# Patient Record
Sex: Male | Born: 1990
Health system: Southern US, Community
[De-identification: ages and names within clinical notes are randomized; demographics above are authoritative.]

## PROBLEM LIST (undated history)

## (undated) DIAGNOSIS — Z8619 Personal history of other infectious and parasitic diseases: Secondary | ICD-10-CM

## (undated) DIAGNOSIS — F32A Depression, unspecified: Secondary | ICD-10-CM

## (undated) DIAGNOSIS — F329 Major depressive disorder, single episode, unspecified: Secondary | ICD-10-CM

## (undated) DIAGNOSIS — F988 Other specified behavioral and emotional disorders with onset usually occurring in childhood and adolescence: Secondary | ICD-10-CM

## (undated) DIAGNOSIS — K219 Gastro-esophageal reflux disease without esophagitis: Secondary | ICD-10-CM

## (undated) DIAGNOSIS — L218 Other seborrheic dermatitis: Secondary | ICD-10-CM

## (undated) DIAGNOSIS — R61 Generalized hyperhidrosis: Secondary | ICD-10-CM

## (undated) HISTORY — DX: Other seborrheic dermatitis: L21.8

## (undated) HISTORY — DX: Other specified behavioral and emotional disorders with onset usually occurring in childhood and adolescence: F98.8

## (undated) HISTORY — DX: Gastro-esophageal reflux disease without esophagitis: K21.9

## (undated) HISTORY — DX: Depression, unspecified: F32.A

## (undated) HISTORY — DX: Personal history of other infectious and parasitic diseases: Z86.19

## (undated) HISTORY — DX: Major depressive disorder, single episode, unspecified: F32.9

## (undated) HISTORY — DX: Generalized hyperhidrosis: R61

---
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
## 2008-08-08 ENCOUNTER — Ambulatory Visit: Payer: Self-pay | Admitting: Licensed Clinical Social Worker

## 2008-08-16 ENCOUNTER — Ambulatory Visit: Payer: Self-pay | Admitting: Licensed Clinical Social Worker

## 2008-08-27 ENCOUNTER — Ambulatory Visit: Payer: Self-pay | Admitting: Licensed Clinical Social Worker

## 2008-09-06 ENCOUNTER — Ambulatory Visit: Payer: Self-pay | Admitting: Licensed Clinical Social Worker

## 2008-09-14 ENCOUNTER — Ambulatory Visit: Payer: Self-pay | Admitting: Family Medicine

## 2008-09-14 DIAGNOSIS — K219 Gastro-esophageal reflux disease without esophagitis: Secondary | ICD-10-CM

## 2008-09-14 DIAGNOSIS — R61 Generalized hyperhidrosis: Secondary | ICD-10-CM

## 2008-09-14 DIAGNOSIS — L218 Other seborrheic dermatitis: Secondary | ICD-10-CM | POA: Insufficient documentation

## 2008-09-14 HISTORY — DX: Other seborrheic dermatitis: L21.8

## 2008-09-14 HISTORY — DX: Generalized hyperhidrosis: R61

## 2008-09-14 HISTORY — DX: Gastro-esophageal reflux disease without esophagitis: K21.9

## 2008-09-20 ENCOUNTER — Ambulatory Visit: Payer: Self-pay | Admitting: Licensed Clinical Social Worker

## 2008-09-21 DIAGNOSIS — F988 Other specified behavioral and emotional disorders with onset usually occurring in childhood and adolescence: Secondary | ICD-10-CM | POA: Insufficient documentation

## 2008-09-21 HISTORY — DX: Other specified behavioral and emotional disorders with onset usually occurring in childhood and adolescence: F98.8

## 2008-10-03 ENCOUNTER — Ambulatory Visit: Payer: Self-pay | Admitting: Licensed Clinical Social Worker

## 2010-06-27 NOTE — Discharge Summary (Signed)
NAME:  Blake [name], SCHOR                        ACCOUNT NO.:  [id number]   MEDICAL RECORD NO.:  [id number]                   PATIENT TYPE:  INP   LOCATION:  0201                                 FACILITY:  [facility]   PHYSICIAN:  Cindie Crumbly, M.D.               DATE OF BIRTH:  [birth year]   DATE OF ADMISSION:  02/26/2002  DATE OF DISCHARGE:  03/03/2002                                 DISCHARGE SUMMARY   REASON FOR ADMISSION:  This 20 year old white male was admitted for  inpatient psychiatric stabilization complaining of suicidal ideation with a  plan to shoot himself with a weapon he had access to.  For further history  of present illness, please see the patient's psychiatric admission  assessment.   PHYSICAL EXAMINATION:  At the time of admission was entirely unremarkable.   LABORATORY DATA:  The patient underwent a laboratory workup to rule out any  other medical problems contributing to his symptomatology.  Urine probe for  gonorrhea and chlamydia were negative.  GGT was within normal limits.  Hepatic panel was within normal limits.  Basic metabolic panel was within  normal limits.  CBC showed hemoglobin of 15.2, MCHC of 34.9 and was  otherwise unremarkable.  Urine drug screen was negative.  TSH and free T4  were within normal limits.  UA was unremarkable.  Because of significant  perseverative behavior demonstrated by the patient, MRI of the brain and a  sleep-deprived EEG were obtained, both of which are pending at the time of  discharge.  The patient received no x-rays, no special procedures, no  additional consultations.  He has sustained no complications during the  course of this hospitalization.   HOSPITAL COURSE:  On admission, the patient was psychomotor retarded with  decreased concentration and attention span.  His affect has been flat.  Mood  has been depressed, irritable with poor impulse control.  He is easily  distracted by extraneous stimuli.  He has been continued  on Zoloft and  titrated up to a dose of 200 mg p.o. q.d.  He was unresponsive to a previous  trial of Strattera and this was discontinued just prior to this admission.  The patient has been started on a trial of Depakote hoping that this may  improve his impulse control and may be beneficial to him if his physical  findings of perseveration and a possible frontal temporal lobe disorder  remain consistent with laboratory findings.  At the time of discharge, the  patient denies any side effects from his medications.  He denies any  suicidal or homicidal ideation.  His affect and mood have improved.  He is  actively participating in all aspects of the therapeutic treatment program.   DIAGNOSES (ACCORDING TO DSM-IV):   AXIS I:  1. Major depression, recurrent, severe without psychosis.  2. Oppositional defiant disorder.  3. Rule out conduct disorder.  4. Attention-deficit hyperactivity disorder, combined-type.  AXIS II:  Rule out learning disorder not otherwise specified.   AXIS III:  Rule out frontal temporal lobe disorder.   AXIS IV:  Current psychosocial stressors are severe.   AXIS V:  20 on admission; 30 on discharge.   FURTHER EVALUATION AND TREATMENT RECOMMENDATIONS:  1. The patient is discharged.  2. He is discharged on an unrestricted level of activity and a regular diet.  3. He is discharged on Depakote ER 500 mg p.o. q.h.s., Zoloft 200 mg p.o.     q.d.  4. He will follow up with Dr. Len Blalock, his outpatient psychiatrist, for     all further aspects of his psychiatric care and I will sign off on the     case at this time.                                               Cindie Crumbly, M.D.    TS/MEDQ  D:  03/03/2002  T:  03/04/2002  Job:  [id number]

## 2010-06-27 NOTE — H&P (Signed)
NAME:  Blake [name], Blake [name]                        ACCOUNT NO.:  [id number]   MEDICAL RECORD NO.:  [id number]                   PATIENT TYPE:  INP   LOCATION:  0201                                 [facility]ACILITY:  [facility]   PHYSICIAN:  Cindie Crumbly, M.D.               DATE OF BIRTH:  November 14, 1990   DATE OF ADMISSION:  02/26/2002  DATE OF DISCHARGE:                         PSYCHIATRIC ADMISSION ASSESSMENT   PATIENT IDENTIFICATION:  This 20 year old white male was admitted for  inpatient psychiatric stabilization complaining of suicidal ideation with a  plan to shoot himself in the temple with a paint ball gun.   HISTORY OF PRESENT ILLNESS:  The patient admits to increasing symptoms of  depression over the past one to two months.  He shot himself in the arm with  a paint ball gun, then threatened to kill himself by shooting himself in the  head with it on the day of admission.  He got on the Internet and instant  messaged his friend a suicide note.  The friend then contacted his parents.  The friend's parents then contacted the patient's parents who transported  him to the hospital for stabilization.  The patient complains of an  increasingly depressed, irritable, and angry mood most of the day nearly  every day, decreased school performance, decreased concentration.  He has  been unable to get out of bed to get to school and has missed at least 10  days of school this past year, which means that he will likely fail the year  and be retained in the eighth grade.  He admits to decreased energy,  increased fatigue, hypersomnia, decreased appetite, giving up on activities  previously enjoyed, feelings of hopelessness, helplessness, and  worthlessness, recurrent thoughts of death.  He is unable to contract for  safety at this time.   PAST PSYCHIATRIC HISTORY:  The patient's past psychiatric history is  significant for recurrent episodes of major depression.  He has one previous  suicide attempt in  the sixth grade when he attempted to eat poisoned  berries.  He has a longstanding history of attention-deficit hyperactivity  disorder, a questionable history of conduct disorder including destruction  of property and truancy.  He has been followed in outpatient therapy by  Onalee Hua L. Toni Arthurs, M.D.  Dr. Toni Arthurs reports that the patient does have  significant perseveration of his behavior to the extent that this may  constitute a frontal lobe disorder.  The patient has no history of previous  inpatient hospitalization.   SUBSTANCE ABUSE HISTORY:  He denies any use of alcohol, tobacco, or street  drugs.   PAST MEDICAL HISTORY:  He denies any medical or surgical problems.   ALLERGIES:  He is allergic to CODEINE.  He has no other known drug allergies  or sensitivities.   CURRENT MEDICATIONS:  His current medication is Zoloft 100 mg p.o. q.d.,  which he has taken for the past two weeks.  Otherwise, he has been taking  Strattera 40 mg per day, which was discontinued three days prior to  admission.  The patient states that the medication made him very angry.   FAMILY AND SOCIAL HISTORY:  The patient lives with his 101 year old sister,  his mother and his father.  He is currently in the eighth grade.  He denies  any family history of psychiatric or neurologic illness.   MENTAL STATUS EXAM:  The patient presents as a well developed, well  nourished, adolescent white male who is alert and oriented x 4, psychomotor  retarded, and whose appearance is compatible with his stated age.  Speech is  coherent with a decreased rate and volume of speech, increased speech  latency.  He displays no looseness of associations, phonemic errors, or  evidence of a thought disorder.  He does display some perseveration of his  behavior.  His concentration and attention span are decreased.  He is easily  distracted by extraneous stimuli.  He displays poor impulse control.  Affect  and mood are depressed, anxious, and  irritable.  Immediate recall, short-  term memory, and remote memory are intact.  Similarities and differences are  within normal limits.  His proverbs are somewhat concrete and consistent  with his educational level.  His thought processes are generally goal  directed but are somewhat perseverative.   ADMISSION DIAGNOSES:   AXIS I:  1. Major depression, recurrent, severe without psychosis.  2. Oppositional defiant disorder.  3. Rule out conduct disorder.  4. Attention-deficit hyperactivity disorder, combined type.   AXIS II:  1. Rule out learning disorder, not otherwise specified.  2. Rule out personality disorder, not otherwise specified.   AXIS III:  Rule out frontal lobe disorder.   AXIS IV:  Severe.   AXIS V:  20   ASSETS AND STRENGTHS:  His parents are very supportive of him.   INITIAL PLAN OF CARE:  Initial plan of care is to continue the patient on  Zoloft.  A trial of Tegretol or Trileptal will likely be considered on an  outpatient basis once the patient returns to see Dr. Toni Arthurs.  Psychotherapy  will focus on improving the patient's impulse control, decreasing cognitive  distortions and potential for self-harm and harm to others.  A laboratory  workup will also be initiated to rule out any other medical problems  contributing to his symptomatology.   ESTIMATED LENGTH OF STAY:  The estimated length of stay for the patient on  the inpatient unit is five to seven days.   POST HOSPITAL CARE PLAN:  Initial discharge plan is to discharge the patient  to home.                                               Cindie Crumbly, M.D.    TS/MEDQ  D:  02/27/2002  T:  02/27/2002  Job:  [id number]

## 2010-10-07 ENCOUNTER — Encounter: Payer: Self-pay | Admitting: Family Medicine

## 2010-10-08 ENCOUNTER — Ambulatory Visit (INDEPENDENT_AMBULATORY_CARE_PROVIDER_SITE_OTHER): Payer: BC Managed Care – PPO | Admitting: Family Medicine

## 2010-10-08 ENCOUNTER — Encounter: Payer: Self-pay | Admitting: Family Medicine

## 2010-10-08 VITALS — BP 110/80 | HR 80 | Temp 98.4°F | Resp 12 | Wt 229.0 lb

## 2010-10-08 DIAGNOSIS — Z0000 Encounter for general adult medical examination without abnormal findings: Secondary | ICD-10-CM

## 2010-10-08 LAB — POCT URINALYSIS DIPSTICK
Blood, UA: NEGATIVE
Leukocytes, UA: NEGATIVE
Nitrite, UA: NEGATIVE
Protein, UA: NEGATIVE
Urobilinogen, UA: 0.2
pH, UA: 6

## 2010-10-08 LAB — CBC WITH DIFFERENTIAL/PLATELET
Basophils Absolute: 0 10*3/uL (ref 0.0–0.1)
Basophils Relative: 0.4 % (ref 0.0–3.0)
Eosinophils Absolute: 0.3 10*3/uL (ref 0.0–0.7)
Eosinophils Relative: 3.4 % (ref 0.0–5.0)
HCT: 50.1 % (ref 39.0–52.0)
Hemoglobin: 17.1 g/dL — ABNORMAL HIGH (ref 13.0–17.0)
Lymphocytes Relative: 27.1 % (ref 12.0–46.0)
Lymphs Abs: 2.4 10*3/uL (ref 0.7–4.0)
MCHC: 34.1 g/dL (ref 30.0–36.0)
MCV: 91.4 fl (ref 78.0–100.0)
Monocytes Absolute: 0.6 10*3/uL (ref 0.1–1.0)
Monocytes Relative: 6.6 % (ref 3.0–12.0)
Neutro Abs: 5.5 10*3/uL (ref 1.4–7.7)
Neutrophils Relative %: 62.5 % (ref 43.0–77.0)
Platelets: 221 10*3/uL (ref 150.0–400.0)
RBC: 5.49 Mil/uL (ref 4.22–5.81)
RDW: 12.9 % (ref 11.5–14.6)
WBC: 8.7 10*3/uL (ref 4.5–10.5)

## 2010-10-08 MED ORDER — TETANUS-DIPHTH-ACELL PERTUSSIS 5-2.5-18.5 LF-MCG/0.5 IM SUSP: 0.5000 mL | Freq: Once | INTRAMUSCULAR | Status: DC

## 2010-10-08 NOTE — Progress Notes (Signed)
Subjective:    Patient ID: Blake [name], male    DOB: 1990-02-12, 20 y.o.   MRN: [id number]  HPI Patient here for complete physical. He has history of recurrent depression and is being followed by psychiatrist. Recent change in medication to Celexa 40 mg daily. No other chronic medical problems. He has also significant anxiety which has been previously treated with Klonopin. No prior surgeries. No known allergies.  Family history significant for both parents with hyperlipidemia. Uncle with alcoholism and depression history.  Last tetanus 2004.  Smokes about one half pack cigarettes per day. Alcohol about 2 beers every other day. No recent escalation in use. Has had some gradual weight gain since high school. Not exercising. Long history of hyperhidrosis. No recent screening lab work  Past Medical History  Diagnosis Date  . ADD 09/21/2008  . GERD 09/14/2008  . HYPERHIDROSIS 09/14/2008  . Other seborrheic dermatitis 09/14/2008  . History of chicken pox   . Depression    History reviewed. No pertinent past surgical history.  reports that he has been smoking Cigarettes.  He has a 2.5 pack-year smoking history. He does not have any smokeless tobacco history on file. He reports that he drinks alcohol. He reports that he does not use illicit drugs. family history includes Alcohol abuse in his other; Hyperlipidemia in his father and mother; and Mental illness in his maternal uncle. No Known Allergies    Review of Systems  Constitutional: Positive for fatigue and unexpected weight change. Negative for fever, activity change and appetite change.  HENT: Negative for ear pain, congestion and trouble swallowing.   Eyes: Negative for pain and visual disturbance.  Respiratory: Negative for cough, shortness of breath and wheezing.   Cardiovascular: Negative for chest pain and palpitations.  Gastrointestinal: Negative for nausea, vomiting, abdominal pain, diarrhea, constipation, blood in stool,  abdominal distention and rectal pain.  Genitourinary: Negative for dysuria, hematuria and testicular pain.  Musculoskeletal: Negative for joint swelling and arthralgias.  Skin: Negative for rash.  Neurological: Negative for dizziness, syncope and headaches.  Hematological: Negative for adenopathy.  Psychiatric/Behavioral: Positive for dysphoric mood. Negative for suicidal ideas and confusion. The patient is nervous/anxious.        Objective:   Physical Exam  Constitutional: He is oriented to person, place, and time. He appears well-developed and well-nourished. No distress.  HENT:  Head: Normocephalic and atraumatic.  Right Ear: External ear normal.  Left Ear: External ear normal.  Mouth/Throat: Oropharynx is clear and moist.  Eyes: Conjunctivae and EOM are normal. Pupils are equal, round, and reactive to light.  Neck: Normal range of motion. Neck supple. No thyromegaly present.  Cardiovascular: Normal rate, regular rhythm and normal heart sounds.   No murmur heard. Pulmonary/Chest: No respiratory distress. He has no wheezes. He has no rales.  Abdominal: Soft. Bowel sounds are normal. He exhibits no distension and no mass. There is no tenderness. There is no rebound and no guarding.  Genitourinary:       Testes normal  Musculoskeletal: He exhibits no edema.  Lymphadenopathy:    He has no cervical adenopathy.  Neurological: He is alert and oriented to person, place, and time. He displays normal reflexes. No cranial nerve deficit.  Skin: No rash noted.  Psychiatric: He has a normal mood and affect.          Assessment & Plan:  Health maintenance. Addressed several items. Needs to quit smoking. Discussed reduction in alcohol. Needs to establish more regular exercise. Obtain screening  lab work.  Tetanus booster given.

## 2010-10-08 NOTE — Patient Instructions (Signed)
Try to quit smoking. Alcohol in moderation (no more than 2 beers per day). Try to establish more regular exercise.

## 2010-10-09 LAB — BASIC METABOLIC PANEL
BUN: 15 mg/dL (ref 6–23)
CO2: 28 mEq/L (ref 19–32)
Calcium: 9.5 mg/dL (ref 8.4–10.5)
Chloride: 104 mEq/L (ref 96–112)
Creatinine, Ser: 1.1 mg/dL (ref 0.4–1.5)
GFR: 86.08 mL/min (ref >60.00)
Glucose, Bld: 89 mg/dL (ref 70–99)
Potassium: 4.7 mEq/L (ref 3.5–5.1)
Sodium: 138 mEq/L (ref 135–145)

## 2010-10-09 LAB — HEPATIC FUNCTION PANEL
ALT: 25 U/L (ref 0–53)
AST: 23 U/L (ref 0–37)
Albumin: 4.8 g/dL (ref 3.5–5.2)
Alkaline Phosphatase: 66 U/L (ref 39–117)
Bilirubin, Direct: 0.1 mg/dL (ref 0.0–0.3)
Total Bilirubin: 0.5 mg/dL (ref 0.3–1.2)
Total Protein: 7.2 g/dL (ref 6.0–8.3)

## 2010-10-09 LAB — LIPID PANEL
Cholesterol: 226 mg/dL — ABNORMAL HIGH (ref 0–200)
HDL: 48.4 mg/dL (ref >39.00)
Total CHOL/HDL Ratio: 5
Triglycerides: 372 mg/dL — ABNORMAL HIGH (ref 0.0–149.0)
VLDL: 74.4 mg/dL — ABNORMAL HIGH (ref 0.0–40.0)

## 2010-10-09 LAB — TSH: TSH: 1.57 u[IU]/mL (ref 0.35–5.50)

## 2010-10-10 NOTE — Progress Notes (Signed)
Quick Note:  Pt informed on VM ______ 

## 2011-08-28 ENCOUNTER — Ambulatory Visit (INDEPENDENT_AMBULATORY_CARE_PROVIDER_SITE_OTHER): Payer: BC Managed Care – PPO | Admitting: Family Medicine

## 2011-08-28 ENCOUNTER — Encounter: Payer: Self-pay | Admitting: Family Medicine

## 2011-08-28 VITALS — BP 120/82 | Temp 98.9°F | Wt 227.0 lb

## 2011-08-28 DIAGNOSIS — F410 Panic disorder [episodic paroxysmal anxiety] without agoraphobia: Secondary | ICD-10-CM | POA: Insufficient documentation

## 2011-08-28 MED ORDER — CLONAZEPAM 0.5 MG PO TABS: 0.5000 mg | ORAL_TABLET | Freq: Two times a day (BID) | ORAL | Status: DC | PRN

## 2011-08-28 NOTE — Progress Notes (Signed)
  Subjective:    Patient ID: Blake [name], male    DOB: Sep 15, 1990, 21 y.o.   MRN: [id number]  HPI  Patient seen for evaluation anxiety issues. He has seen psychiatrist previously but not for several months now. He has history of what sounds like social anxiety disorder and panic disorder. Has been on multiple medications including Celexa, Lexapro, and Cymbalta but did not have good relief with these. Increased fatigue with all 3. He was several years ago on clonazepam 0.5 mg twice daily as needed which she felt worked the best. He tries not to take regularly. Panic disorder symptoms are somewhat infrequent. No clear consistent trigger for anxiety other than crowded social situations. He is reluctant to try any further serotonin medications. He has not tried sertraline previously. No history of any illicit drug use.   Review of Systems  Constitutional: Negative for fever and chills.  Neurological: Negative for dizziness.  Psychiatric/Behavioral: Negative for suicidal ideas and dysphoric mood. The patient is nervous/anxious.        Objective:   Physical Exam  Constitutional: He appears well-developed and well-nourished.  Cardiovascular: Normal rate and regular rhythm.  Exam reveals no gallop.   Pulmonary/Chest: Effort normal and breath sounds normal. No respiratory distress. He has no wheezes. He has no rales.  Psychiatric: He has a normal mood and affect. His behavior is normal. Judgment and thought content normal.          Assessment & Plan:  Anxiety. By history, sounds like he has social anxiety and panic disorder. Refill clonazepam 0.5 mg 1 twice a day when necessary. We discussed other potential options such as sertraline but at this point is not interested

## 2011-12-10 ENCOUNTER — Other Ambulatory Visit: Payer: Self-pay | Admitting: Family Medicine

## 2011-12-11 NOTE — Telephone Encounter (Signed)
Refill for 3 months. 

## 2011-12-11 NOTE — Telephone Encounter (Signed)
done

## 2011-12-11 NOTE — Telephone Encounter (Signed)
Clonazepam bid prn last filled 08-28-11, #60 with 3 refills

## 2012-02-17 ENCOUNTER — Other Ambulatory Visit: Payer: Self-pay | Admitting: Family Medicine

## 2012-02-18 NOTE — Telephone Encounter (Signed)
Clonazepam last filled 12-10-11, #60 with 2 refills.  Pt is scheduled for follow-up mid January

## 2012-02-18 NOTE — Telephone Encounter (Signed)
Refill once 

## 2012-02-29 ENCOUNTER — Ambulatory Visit: Payer: BC Managed Care – PPO | Admitting: Family Medicine

## 2012-03-04 ENCOUNTER — Encounter: Payer: Self-pay | Admitting: Family Medicine

## 2012-03-04 ENCOUNTER — Ambulatory Visit (INDEPENDENT_AMBULATORY_CARE_PROVIDER_SITE_OTHER): Payer: BC Managed Care – PPO | Admitting: Family Medicine

## 2012-03-04 VITALS — BP 100/64 | Temp 98.9°F | Wt 236.0 lb

## 2012-03-04 DIAGNOSIS — F410 Panic disorder [episodic paroxysmal anxiety] without agoraphobia: Secondary | ICD-10-CM

## 2012-03-04 MED ORDER — TRIAMCINOLONE ACETONIDE 0.1 % EX CREA: TOPICAL_CREAM | Freq: Two times a day (BID) | CUTANEOUS | Status: DC | PRN

## 2012-03-04 MED ORDER — CLONAZEPAM 0.5 MG PO TABS: 0.5000 mg | ORAL_TABLET | Freq: Two times a day (BID) | ORAL | Status: DC

## 2012-03-04 NOTE — Progress Notes (Signed)
  Subjective:    Patient ID: Blake [name], male    DOB: [birth year], 22 y.o.   MRN: [id number]  HPI Followup anxiety disorder. History of panic disorder. Previously tried several serotonin medications including Lexapro, Zoloft, Cymbalta and had fatigue with each of these. He has tolerated clonazepam 0.5 mg twice daily. Probably social anxiety disorder as well. Symptoms well controlled. No history of misuse.  Intermittent twitching right upper eyelid. Noted last week. No visual changes. No muscle cramps. No exacerbating factors. No alleviating factors   Review of Systems  Respiratory: Negative for shortness of breath.   Cardiovascular: Negative for chest pain.  Psychiatric/Behavioral: Negative for sleep disturbance and dysphoric mood. The patient is nervous/anxious.        Objective:   Physical Exam  Constitutional: He is oriented to person, place, and time. He appears well-developed and well-nourished.  Eyes: Conjunctivae normal and EOM are normal. Pupils are equal, round, and reactive to light. Right eye exhibits no discharge. Left eye exhibits no discharge.  Neck: Neck supple. No thyromegaly present.  Cardiovascular: Normal rate and regular rhythm.   Pulmonary/Chest: Effort normal and breath sounds normal. No respiratory distress. He has no wheezes. He has no rales.  Musculoskeletal: He exhibits no edema.  Neurological: He is alert and oriented to person, place, and time. No cranial nerve deficit.          Assessment & Plan:  #1 anxiety disorder. Probable panic disorder and social anxiety disorder. Very well-controlled on Klonopin. History of intolerance to multiple serotonin reuptake inhibitors. Refill clonazepam for 6 months #2 benign fasciculation right eyelid. Reassurance

## 2012-09-05 ENCOUNTER — Other Ambulatory Visit: Payer: Self-pay | Admitting: Family Medicine

## 2012-09-05 NOTE — Telephone Encounter (Signed)
Last refill 03/04/12 #60 5 refills

## 2012-09-06 NOTE — Telephone Encounter (Signed)
Refill for 6 months.  Will need office f/u before further refills.

## 2012-12-15 ENCOUNTER — Other Ambulatory Visit: Payer: Self-pay

## 2013-02-20 ENCOUNTER — Ambulatory Visit (INDEPENDENT_AMBULATORY_CARE_PROVIDER_SITE_OTHER): Payer: BC Managed Care – PPO | Admitting: Family Medicine

## 2013-02-20 ENCOUNTER — Encounter: Payer: Self-pay | Admitting: Family Medicine

## 2013-02-20 VITALS — BP 104/70 | HR 112 | Temp 98.9°F | Wt 233.0 lb

## 2013-02-20 DIAGNOSIS — F410 Panic disorder [episodic paroxysmal anxiety] without agoraphobia: Secondary | ICD-10-CM

## 2013-02-20 DIAGNOSIS — L989 Disorder of the skin and subcutaneous tissue, unspecified: Secondary | ICD-10-CM

## 2013-02-20 NOTE — Progress Notes (Signed)
Pre visit review using our clinic review tool, if applicable. No additional management support is needed unless otherwise documented below in the visit note. 

## 2013-02-20 NOTE — Patient Instructions (Signed)
Your back lesion is likely an angioma or angiofibroma.  These are benign lesions but can be easily removed if bothersome.

## 2013-02-20 NOTE — Progress Notes (Signed)
   Subjective:    Patient ID: Blake [name], male    DOB: 1990/08/04, 23 y.o.   MRN: [id number]  HPI Patient seen for medical follow up He has history of anxiety disorder with probable panic disorder and social anxiety. Has tried multiple serotonin reuptake inhibitors but had side effects with all these. He has been on regimen of clonazepam 0.5 mg twice a day for several years. This is controlling his anxiety symptoms well. No history of depression.  Separate issue is erythematous skin lesion right upper back which has been present for several months- possibly one year. No recent changes in size. Occasional irritation with clothing. No itching.  Past Medical History  Diagnosis Date  . ADD 09/21/2008  . GERD 09/14/2008  . HYPERHIDROSIS 09/14/2008  . Other seborrheic dermatitis 09/14/2008  . History of chicken pox   . Depression    No past surgical history on file.  reports that he has quit smoking. His smoking use included Cigarettes. He has a 2.5 pack-year smoking history. He does not have any smokeless tobacco history on file. He reports that he drinks alcohol. He reports that he does not use illicit drugs. family history includes Alcohol abuse in his other; Hyperlipidemia in his father and mother; Mental illness in his maternal uncle. No Known Allergies    Review of Systems  Psychiatric/Behavioral: Negative for dysphoric mood and agitation. The patient is nervous/anxious.        Objective:   Physical Exam  Constitutional: He appears well-developed and well-nourished.  Cardiovascular: Normal rate and regular rhythm.   No murmur heard. Pulmonary/Chest: Effort normal and breath sounds normal. No respiratory distress. He has no wheezes. He has no rales.  Skin:  Right upper back trapezius region reveals about 3-4 mm well demarcated circumferential symmetric angiomatous lesion which blanches with pressure          Assessment & Plan:  #1 anxiety disorder panic disorder and  probable social anxiety disorder. Controlled with clonazepam. Previous intolerance with SSRIs. #2 skin lesion right upper back. Suspect angiofibroma or angioma. He'll schedule for excisional biopsy

## 2013-02-28 ENCOUNTER — Other Ambulatory Visit: Payer: Self-pay | Admitting: Family Medicine

## 2013-03-01 NOTE — Telephone Encounter (Signed)
Refill for 6 months. 

## 2013-03-01 NOTE — Telephone Encounter (Signed)
Last visit 02-20-13 Last refill 09-05-12 #60 5 refill

## 2013-03-17 ENCOUNTER — Emergency Department (HOSPITAL_COMMUNITY)
Admission: EM | Admit: 2013-03-17 | Discharge: 2013-03-18 | Disposition: A | Discharge status: Home / Self Care | Payer: BC Managed Care – PPO | Attending: Emergency Medicine | Admitting: Emergency Medicine

## 2013-03-17 ENCOUNTER — Encounter (HOSPITAL_COMMUNITY): Payer: Self-pay | Admitting: Emergency Medicine

## 2013-03-17 DIAGNOSIS — F3289 Other specified depressive episodes: Secondary | ICD-10-CM | POA: Insufficient documentation

## 2013-03-17 DIAGNOSIS — Z8619 Personal history of other infectious and parasitic diseases: Secondary | ICD-10-CM | POA: Insufficient documentation

## 2013-03-17 DIAGNOSIS — F988 Other specified behavioral and emotional disorders with onset usually occurring in childhood and adolescence: Secondary | ICD-10-CM | POA: Insufficient documentation

## 2013-03-17 DIAGNOSIS — S6992XA Unspecified injury of left wrist, hand and finger(s), initial encounter: Secondary | ICD-10-CM

## 2013-03-17 DIAGNOSIS — S6710XA Crushing injury of unspecified finger(s), initial encounter: Secondary | ICD-10-CM | POA: Insufficient documentation

## 2013-03-17 DIAGNOSIS — L74519 Primary focal hyperhidrosis, unspecified: Secondary | ICD-10-CM | POA: Insufficient documentation

## 2013-03-17 DIAGNOSIS — Z79899 Other long term (current) drug therapy: Secondary | ICD-10-CM | POA: Insufficient documentation

## 2013-03-17 DIAGNOSIS — F329 Major depressive disorder, single episode, unspecified: Secondary | ICD-10-CM | POA: Insufficient documentation

## 2013-03-17 DIAGNOSIS — Y929 Unspecified place or not applicable: Secondary | ICD-10-CM | POA: Insufficient documentation

## 2013-03-17 DIAGNOSIS — Y9389 Activity, other specified: Secondary | ICD-10-CM | POA: Insufficient documentation

## 2013-03-17 DIAGNOSIS — Z87891 Personal history of nicotine dependence: Secondary | ICD-10-CM | POA: Insufficient documentation

## 2013-03-17 DIAGNOSIS — W230XXA Caught, crushed, jammed, or pinched between moving objects, initial encounter: Secondary | ICD-10-CM | POA: Insufficient documentation

## 2013-03-17 NOTE — ED Provider Notes (Signed)
CSN: [id number]     Arrival [id number] & time 03/17/13  2315 History   First MD Initiated Contact with Patient 03/17/13 2326     Chief Complaint  Patient presents with  . Finger Injury   (Consider location/radiation/quality/duration/timing/severity/associated sxs/prior Treatment) The history is provided by the patient and medical records. No language interpreter was used.    Blake [name] is a 23 y.o. male  with a hx of GERD, ADD, depression presents to the Emergency Department complaining of getting his left middle finger caught and crushed while working on a car onset 20 minutes prior to arrival. Associated symptoms include bleeding and pain at the site.  Nothing makes it better or worse.  Patient reports it hurts badly but he does not like blood either. He states he feels "woozy. Patient denies fever, chills, headache neck pain, chest pain, shortness of breath, abdominal pain, nausea, vomiting, diarrhea, weakness, dizziness, syncope.  Last tetanus 2-3 years ago.  Past Medical History  Diagnosis Date  . ADD 09/21/2008  . GERD 09/14/2008  . HYPERHIDROSIS 09/14/2008  . Other seborrheic dermatitis 09/14/2008  . History of chicken pox   . Depression    History reviewed. No pertinent past surgical history. Family History  Problem Relation Age of Onset  . Hyperlipidemia Mother   . Hyperlipidemia Father   . Mental illness Maternal Uncle   . Alcohol abuse Other     uncle   History  Substance Use Topics  . Smoking status: Former Smoker -- 0.50 packs/day for 5 years    Types: Cigarettes  . Smokeless tobacco: Not on file     Comment: E-Cig  . Alcohol Use: Yes    Review of Systems  Constitutional: Negative for fever and chills.  Gastrointestinal: Negative for nausea and vomiting.  Musculoskeletal: Positive for arthralgias and joint swelling. Negative for back pain, neck pain and neck stiffness.  Skin: Positive for wound.  Allergic/Immunologic: Negative for immunocompromised state.   Neurological: Negative for weakness and numbness.  Hematological: Does not bruise/bleed easily.  Psychiatric/Behavioral: The patient is not nervous/anxious.   All other systems reviewed and are negative.    Allergies  Review of patient's allergies indicates no known allergies.  Home Medications   Current Outpatient Rx  Name  Route  Sig  Dispense  Refill  . clonazePAM (KLONOPIN) 0.5 MG tablet      TAKE 1 TABLET TWICE A DAY. NEEDS OFFICE VISIT FOR MORE   60 tablet   5   . doxylamine, Sleep, (UNISOM) 25 MG tablet   Oral   Take 25 mg by mouth at bedtime as needed.         . triamcinolone cream (KENALOG) 0.1 %   Topical   Apply topically 2 (two) times daily as needed.   30 g   3    BP 127/77  Pulse 86  Temp(Src) 98.1 F (36.7 C) (Oral)  Resp 24  Ht 5\' 10"  (1.778 m)  Wt 225 lb (102.059 kg)  BMI 32.28 kg/m2  SpO2 99% Physical Exam  Nursing note and vitals reviewed. Constitutional: He is oriented to person, place, and time. He appears well-developed and well-nourished. No distress.  HENT:  Head: Normocephalic and atraumatic.  Eyes: Conjunctivae are normal. No scleral icterus.  Neck: Normal range of motion.  Cardiovascular: Regular rhythm, normal heart sounds and intact distal pulses.   No murmur heard. Capillary refill < 3 sec in all fingers on the elft hand except Left long - approx 5 sec in  the Left long finger  Pulmonary/Chest: Effort normal and breath sounds normal. No respiratory distress.  Clear and equal breath sounds  Musculoskeletal: Normal range of motion. He exhibits tenderness. He exhibits no edema.  ROM: Full range of motion of the MCP, PIP and DIPs of all fingers of the left hand, significant pain to range of motion of the left long finger  Partial amputation of the tip of the left long finger  Neurological: He is alert and oriented to person, place, and time. Coordination normal.  Sensation: Intact to the proximal portion of the finger, no sensation  to the very distal tip Strength: 5 out of 5 with resisted flexion and extension but with pain  Skin: Skin is warm. He is diaphoretic. There is erythema. There is pallor.  No tenting of the skin Pale  Psychiatric: He has a normal mood and affect.    ED Course  Procedures (including critical care time) Labs Review Labs Reviewed - No data to display Imaging Review Dg Hand Complete Left  03/18/2013   CLINICAL DATA:  23 year old male status post smash injury to left middle finger while working on car. Initial encounter.  EXAM: LEFT HAND - COMPLETE 3+ VIEW  COMPARISON:  None.  FINDINGS: Severely comminuted fracture through the distal third left third distal phalanx, with severe associated soft tissue injury. Appearance of near amputation. Numerous small comminution fragments. DIP preserved and intact.  No other osseous injury identified in the left hand. Distal left radius and ulna intact.  IMPRESSION: Severely comminuted extra-articular fracture left third distal phalanx with severe soft tissue injury; near traumatic amputation.   Electronically Signed   By: Augusto Gamble M.D.   On: 03/18/2013 00:26    EKG Interpretation   None       MDM   1. Injury of left middle finger   2. Crush injury to finger      Blake Ryder presents with traumatic partial amputation of the left long finger. X-ray with Severely comminuted extra-articular fracture left third distal phalanx with severe soft tissue injury; near traumatic amputation.  Patient initially hypertensive, pale and diaphoretic likely vagal response.  Last oral intake 7pm.  Tetanus up-to-date.  12:43 AM Patient blood pressure improved now with pain in the left finger. Will provide pain control.  1:01 AM Discussed with Dr. Merlyn Lot. He will take patient to surgery tonight.  Pt begun on Ancef.      Dahlia Client Federica Allport, PA-C 03/18/13 [phone]

## 2013-03-17 NOTE — ED Notes (Signed)
Pt from home with crush injury to L 3rd finger sustained while working on a car approx PTA, bleeding controlled at this time, tip of finger missing.  10/10 pain. Pt denies other complaints.  Otherwise skin pale/w/d. MAEI, +csm/+pulses.

## 2013-03-18 ENCOUNTER — Emergency Department (HOSPITAL_COMMUNITY): Payer: BC Managed Care – PPO

## 2013-03-18 MED ORDER — BUPIVACAINE HCL (PF) 0.5 % IJ SOLN
INTRAMUSCULAR | Status: AC
  Filled 2013-03-18: qty 30

## 2013-03-18 MED ORDER — SULFAMETHOXAZOLE-TMP DS 800-160 MG PO TABS: 1.0000 | ORAL_TABLET | Freq: Two times a day (BID) | ORAL | Status: DC

## 2013-03-18 MED ORDER — OXYCODONE-ACETAMINOPHEN 5-325 MG PO TABS
2.0000 | ORAL_TABLET | Freq: Once | ORAL | Status: AC
  Administered 2013-03-18: 2 via ORAL
  Filled 2013-03-18: qty 2

## 2013-03-18 MED ORDER — OXYCODONE-ACETAMINOPHEN 5-325 MG PO TABS: 1.0000 | ORAL_TABLET | ORAL | Status: DC | PRN

## 2013-03-18 MED ORDER — CEFAZOLIN SODIUM 1-5 GM-% IV SOLN
1.0000 g | Freq: Once | INTRAVENOUS | Status: AC
  Administered 2013-03-18: 1 g via INTRAVENOUS
  Filled 2013-03-18: qty 50

## 2013-03-18 MED ORDER — FENTANYL CITRATE 0.05 MG/ML IJ SOLN
50.0000 ug | INTRAMUSCULAR | Status: DC | PRN
  Administered 2013-03-18: 50 ug via INTRAVENOUS
  Filled 2013-03-18: qty 2

## 2013-03-18 NOTE — ED Notes (Signed)
Surgeon at bedside,  Consent signed for I& D  Crush injury

## 2013-03-18 NOTE — ED Provider Notes (Signed)
Medical screening examination/treatment/procedure(s) were performed by non-physician practitioner and as supervising physician I was immediately available for consultation/collaboration.     Olivia Mackielga M Vishruth Seoane, MD 03/18/13 [phone]

## 2013-03-18 NOTE — Discharge Instructions (Signed)
1. Medications: bactrim, percocet, usual home medications 2. Treatment: rest, drink plenty of fluids, care for your splint and wound as directed by Dr. Merlyn LotKuzma 3. Follow Up: Please followup with Dr. Merlyn LotKuzma as directed in 11 days   Fingertip Injuries and Amputations Fingertip injuries are common and often get injured because they are last to escape when pulling your hand out of harm's way. You have amputated (cut off) part of your finger. How this turns out depends largely on how much was amputated. If just the tip is amputated, often the end of the finger will grow back and the finger may return to much the same as it was before the injury.  If more of the finger is missing, your caregiver has done the best with the tissue remaining to allow you to keep as much finger as is possible. Your caregiver after checking your injury has tried to leave you with a painless fingertip that has durable, feeling skin. If possible, your caregiver has tried to maintain the finger's length and appearance and preserve its fingernail.  Please read the instructions outlined below and refer to this sheet in the next few weeks. These instructions provide you with general information on caring for yourself. Your caregiver may also give you specific instructions. While your treatment has been done according to the most current medical practices available, unavoidable complications occasionally occur. If you have any problems or questions after discharge, please call your caregiver. HOME CARE INSTRUCTIONS   You may resume normal diet and activities as directed or allowed.  Keep your hand elevated above the level of your heart. This helps decrease pain and swelling.  Keep ice packs (or a bag of ice wrapped in a towel) on the injured area for 15-20 minutes, 03-04 times per day, for the first two days.  Change dressings if necessary or as directed.  Clean the wound daily or as directed.  Only take over-the-counter or  prescription medicines for pain, discomfort, or fever as directed by your caregiver.  Keep appointments as directed. SEEK IMMEDIATE MEDICAL CARE IF:  You develop redness, swelling, numbness or increasing pain in the wound.  There is pus coming from the wound.  You develop an unexplained oral temperature above 102 F (38.9 C) or as your caregiver suggests.  There is a foul (bad) smell coming from the wound or dressing.  There is a breaking open of the wound (edges not staying together) after sutures or staples have been removed. MAKE SURE YOU:   Understand these instructions.  Will watch your condition.  Will get help right away if you are not doing well or get worse. Document Released: 12/17/2004 Document Revised: 04/20/2011 Document Reviewed: 11/16/2007 [facility] Patient Information 2014 [place] [place].

## 2013-03-19 NOTE — Consult Note (Signed)
NAMEJOAHAN, [name] NO.:  [id number]  MEDICAL RECORD NO.:  [id number]  LOCATION:  [id number]                         FACILITY:  [facility]  PHYSICIAN:  Blake Loa, MD        DATE OF BIRTH:  1990-05-04  DATE OF CONSULTATION:  03/18/2013 DATE OF DISCHARGE:  03/18/2013                                CONSULTATION   Consult is from [facility] Emergency Department.  Consult is for left long fingertip crush injury.  HISTORY:  Blake [name] is a 23 year old, right-hand dominant male who states he was working on a suspension spring on a vehicle when the spring crushed his left long fingertip.  He had to use a crowbar to get it out.  He was seen at [facility] Emergency Department, where he was evaluated.  I was consulted for management of injury.  He reports no previous injuries to the hand and no other injuries at this time.  ALLERGIES:  No known drug allergies.  PAST MEDICAL HISTORY:  Hyperhidrosis, panic disorder, GERD.  PAST SURGICAL HISTORY:  None.  MEDICATIONS:  Klonopin and doxylamine.  SOCIAL HISTORY:  Blake [name] does not currently work.  He uses a vapor cigarette and drinks occasionally.  REVIEW OF SYSTEMS:  A 13-point review of systems was negative.  PHYSICAL EXAMINATION:  GENERAL:  Alert and oriented x3, well developed, well nourished.  He is resting comfortably in the hospital stretcher. EXTREMITIES:  Bilateral upper extremities are intact to light touch sensation, capillary refill in the fingertips with exception of the left long finger, where he has decreased sensation in the tip of the finger. He has had a digital block which is starting to wear off.  He has good capillary refill.  Right upper extremity wounds are tender to palpation. Left upper extremity with the exception of the long finger.  No wounds or tenderness to palpation.  In the long finger, he has a laceration at the ulnar aspect of the distal phalanx through the base of the nail bed. The tissues are  intact on the radial side, disrupted on the ulnar side. No gross contamination.  No other wounds.  He is able to flex and extend at the DIP joint.  RADIOGRAPHS:  AP, lateral, and oblique views of the hand show a fracture of the distal phalanx at the proximal aspect of the tuft of the long finger.  No other fractures, dislocations, or radiopaque foreign bodies are noted.  ASSESSMENT AND PLAN:  Left long fingertip crush injury.  I discussed with Blake [name] the nature of the injury.  I recommended repair of the laceration and reduction of the fracture under digital block in the emergency department.  Risks, benefits and alternatives of the surgery were discussed including risk of blood loss, infection, damage to deeper structures, failure of the procedure, need for additional procedures; complications with wound healing, and nail deformity.  He voiced understanding of these risks and elected to proceed.  PROCEDURE NOTE:  Digital block was performed with 10 mL of half and half solution of 1% plain lidocaine and 0.25% plain Marcaine.  This was adequate to give total digital anesthesia.  The wound was copiously irrigated with a 1000 mL  of sterile saline and Betadine solution.  It was then sterilely prepped with Betadine and draped with sterile towels. A Penrose drain was used as a tourniquet and was up for approximately 40 minutes.  The nail was removed with a Therapist, nutritionalreer elevator.  The wound was then cleaned of its clot.  The fracture was reduced under direct visualization.  A 4-0 Monocryl suture was then used in interrupted fashion to repair the skin wounds.  The nail bed laceration was repaired with 6-0 chromic suture in a interrupted or a horizontal mattress fashion.  This provided good apposition of all soft tissues.  A piece of Xeroform was placed in the nail fold and all wounds were dressed with sterile Xeroform, 4x4s, and a removable splint.  This was wrapped with Coban dressing lightly.   The Penrose drain was removed.  He tolerated the procedure well.  I will see him back in the office in approximately 1-1/2 weeks as he is going out of town for a week.  He will avoid heavy gripping, grasping, pushing and pulling activities while he is out of town.  He will be given pain meds and antibiotics per the emergency department.     Blake LoaKevin Marlon Suleiman, MD     KK/MEDQ  D:  03/18/2013  T:  03/19/2013  Job:  [id number]

## 2013-08-13 ENCOUNTER — Other Ambulatory Visit: Payer: Self-pay | Admitting: Family Medicine

## 2013-08-14 NOTE — Telephone Encounter (Signed)
Last visit 02/20/13 Last refill 02/28/13 #60 5 refill

## 2013-08-14 NOTE — Telephone Encounter (Signed)
Refill for 6 months. 

## 2014-01-17 ENCOUNTER — Encounter: Payer: Self-pay | Admitting: Family Medicine

## 2014-01-17 ENCOUNTER — Ambulatory Visit (INDEPENDENT_AMBULATORY_CARE_PROVIDER_SITE_OTHER): Payer: BC Managed Care – PPO | Admitting: Family Medicine

## 2014-01-17 VITALS — BP 120/80 | HR 86 | Temp 97.9°F | Wt 220.0 lb

## 2014-01-17 DIAGNOSIS — F410 Panic disorder [episodic paroxysmal anxiety] without agoraphobia: Secondary | ICD-10-CM

## 2014-01-17 MED ORDER — CLONAZEPAM 0.5 MG PO TABS: 0.5000 mg | ORAL_TABLET | Freq: Two times a day (BID) | ORAL | Status: DC

## 2014-01-17 NOTE — Progress Notes (Signed)
   Subjective:    Patient ID: Blake [name], male    DOB: Sep 03, 1990, 23 y.o.   MRN: [id number]  HPI Follow-up regarding chronic anxiety disorder. He has history of probable panic disorder. As per previous notes he was on many different SSRI medications previously but had side effects with all of them. He has for many years been on low-dose clonazepam 0.5 mg twice a day. No history of misuse. Rare alcohol use. Symptoms stable. No depression. He's lost some weight this year due to his efforts.  Past Medical History  Diagnosis Date  . ADD 09/21/2008  . GERD 09/14/2008  . HYPERHIDROSIS 09/14/2008  . Other seborrheic dermatitis 09/14/2008  . History of chicken pox   . Depression    No past surgical history on file.  reports that he has quit smoking. His smoking use included Cigarettes. He has a 2.5 pack-year smoking history. He does not have any smokeless tobacco history on file. He reports that he drinks alcohol. He reports that he does not use illicit drugs. family history includes Alcohol abuse in his other; Hyperlipidemia in his father and mother; Mental illness in his maternal uncle. No Known Allergies    Review of Systems  Constitutional: Negative for appetite change and unexpected weight change.  Respiratory: Negative for cough and shortness of breath.   Cardiovascular: Negative for chest pain.  Psychiatric/Behavioral: Negative for dysphoric mood and agitation.       Objective:   Physical Exam  Constitutional: He appears well-developed and well-nourished. No distress.  Neck: Neck supple. No thyromegaly present.  Cardiovascular: Normal rate and regular rhythm.   No murmur heard. Pulmonary/Chest: Effort normal and breath sounds normal. No respiratory distress. He has no wheezes. He has no rales.  Lymphadenopathy:    He has no cervical adenopathy.  Psychiatric: He has a normal mood and affect. His behavior is normal. Judgment and thought content normal.          Assessment &  Plan:  Anxiety disorder with probable panic attack. Intolerant of multiple SSRIs in the past. Refill clonazepam for 6 months. He has been stable on low dose.

## 2014-01-17 NOTE — Progress Notes (Signed)
Pre visit review using our clinic review tool, if applicable. No additional management support is needed unless otherwise documented below in the visit note. 

## 2014-05-21 ENCOUNTER — Ambulatory Visit: Payer: BLUE CROSS/BLUE SHIELD | Admitting: Psychology

## 2014-05-21 DIAGNOSIS — F329 Major depressive disorder, single episode, unspecified: Secondary | ICD-10-CM | POA: Diagnosis not present

## 2014-05-21 DIAGNOSIS — F419 Anxiety disorder, unspecified: Secondary | ICD-10-CM | POA: Diagnosis not present

## 2014-05-21 DIAGNOSIS — F900 Attention-deficit hyperactivity disorder, predominantly inattentive type: Secondary | ICD-10-CM | POA: Diagnosis not present

## 2014-06-11 ENCOUNTER — Ambulatory Visit: Payer: BLUE CROSS/BLUE SHIELD | Admitting: Psychology

## 2014-06-11 DIAGNOSIS — F902 Attention-deficit hyperactivity disorder, combined type: Secondary | ICD-10-CM | POA: Diagnosis not present

## 2014-06-11 DIAGNOSIS — F4323 Adjustment disorder with mixed anxiety and depressed mood: Secondary | ICD-10-CM | POA: Diagnosis not present

## 2014-06-19 ENCOUNTER — Ambulatory Visit: Payer: BLUE CROSS/BLUE SHIELD | Admitting: Psychology

## 2014-06-19 DIAGNOSIS — F900 Attention-deficit hyperactivity disorder, predominantly inattentive type: Secondary | ICD-10-CM | POA: Diagnosis not present

## 2014-06-19 DIAGNOSIS — F411 Generalized anxiety disorder: Secondary | ICD-10-CM | POA: Diagnosis not present

## 2014-06-21 ENCOUNTER — Ambulatory Visit: Payer: BLUE CROSS/BLUE SHIELD | Admitting: Psychology

## 2014-07-05 ENCOUNTER — Ambulatory Visit: Payer: BLUE CROSS/BLUE SHIELD | Admitting: Psychology

## 2014-07-05 DIAGNOSIS — F320 Major depressive disorder, single episode, mild: Secondary | ICD-10-CM | POA: Diagnosis not present

## 2014-07-05 DIAGNOSIS — F411 Generalized anxiety disorder: Secondary | ICD-10-CM | POA: Diagnosis not present

## 2014-07-05 DIAGNOSIS — F900 Attention-deficit hyperactivity disorder, predominantly inattentive type: Secondary | ICD-10-CM | POA: Diagnosis not present

## 2014-07-12 ENCOUNTER — Other Ambulatory Visit: Payer: Self-pay | Admitting: Family Medicine

## 2014-07-13 NOTE — Telephone Encounter (Signed)
Refill for 6 months. 

## 2014-07-13 NOTE — Telephone Encounter (Signed)
Last visit 01/17/14 Last refill 01/17/14 #60 5 refill

## 2014-07-16 ENCOUNTER — Ambulatory Visit: Payer: BLUE CROSS/BLUE SHIELD | Admitting: Psychology

## 2014-07-30 ENCOUNTER — Ambulatory Visit: Payer: BLUE CROSS/BLUE SHIELD | Admitting: Psychology

## 2014-08-09 ENCOUNTER — Ambulatory Visit: Payer: BLUE CROSS/BLUE SHIELD | Admitting: Psychology

## 2014-10-23 IMAGING — CR DG HAND COMPLETE 3+V*L*
3 series · 3 of 3 positions shown · non-contrast
Comparison: None.

CLINICAL DATA: 22-year-old male status post smash injury to left
middle finger while working on car. Initial encounter.

EXAM:
LEFT HAND - COMPLETE 3+ VIEW

[x hand pa left]
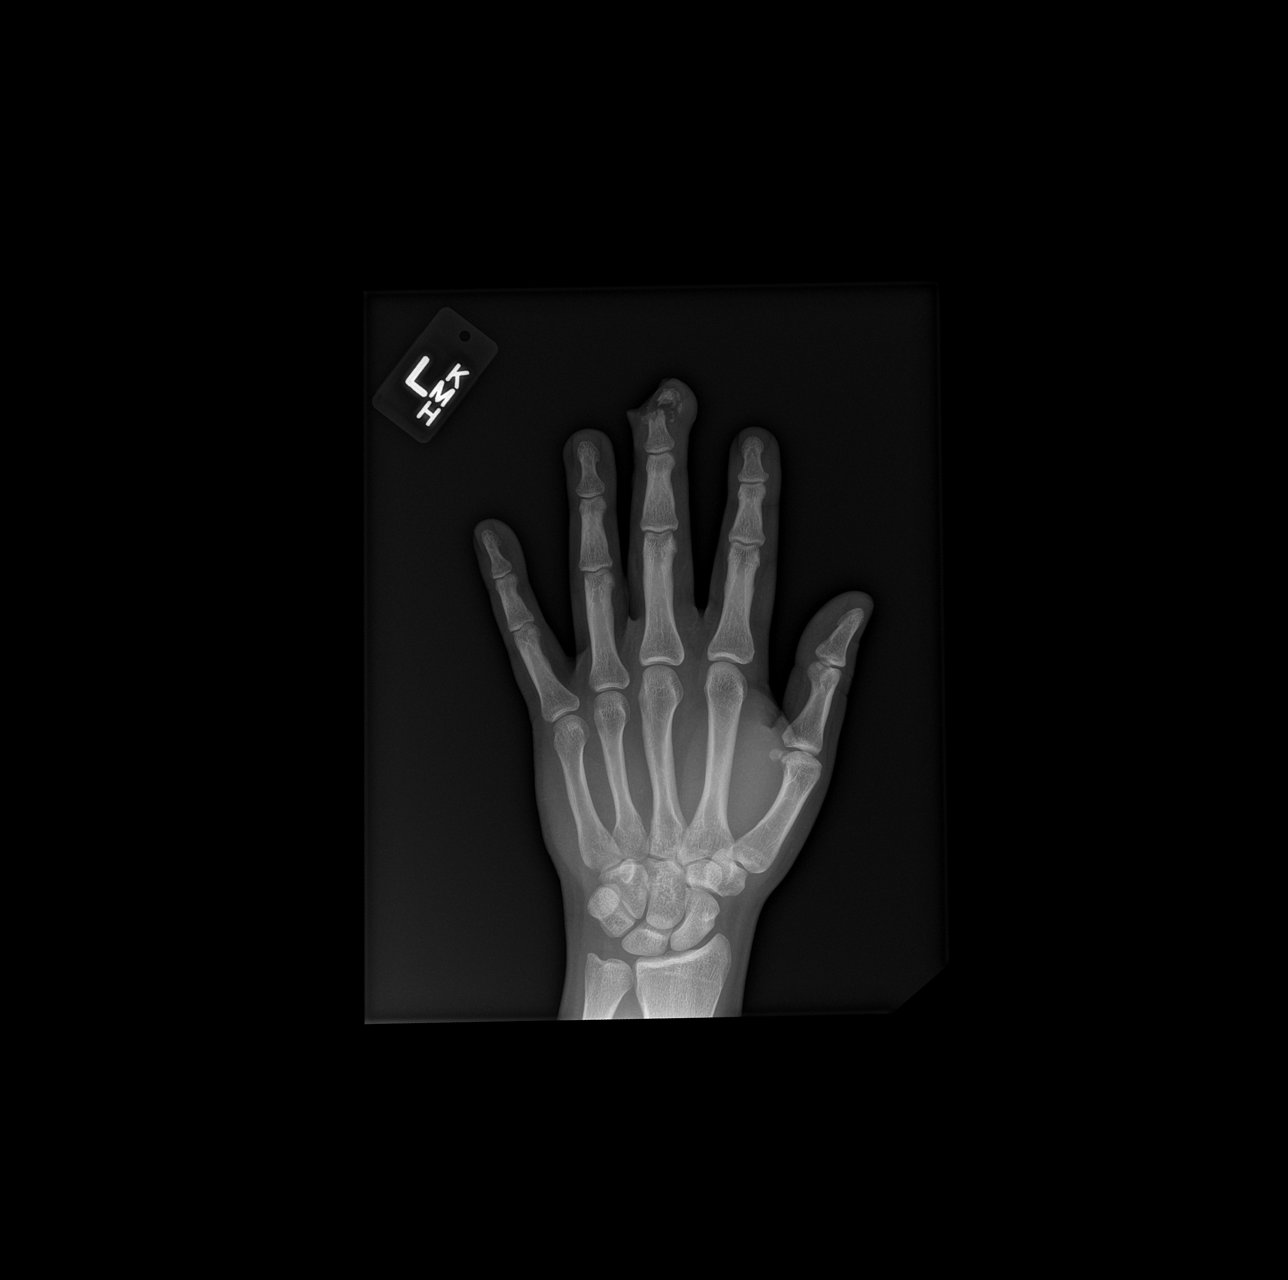

[x hand obl left]
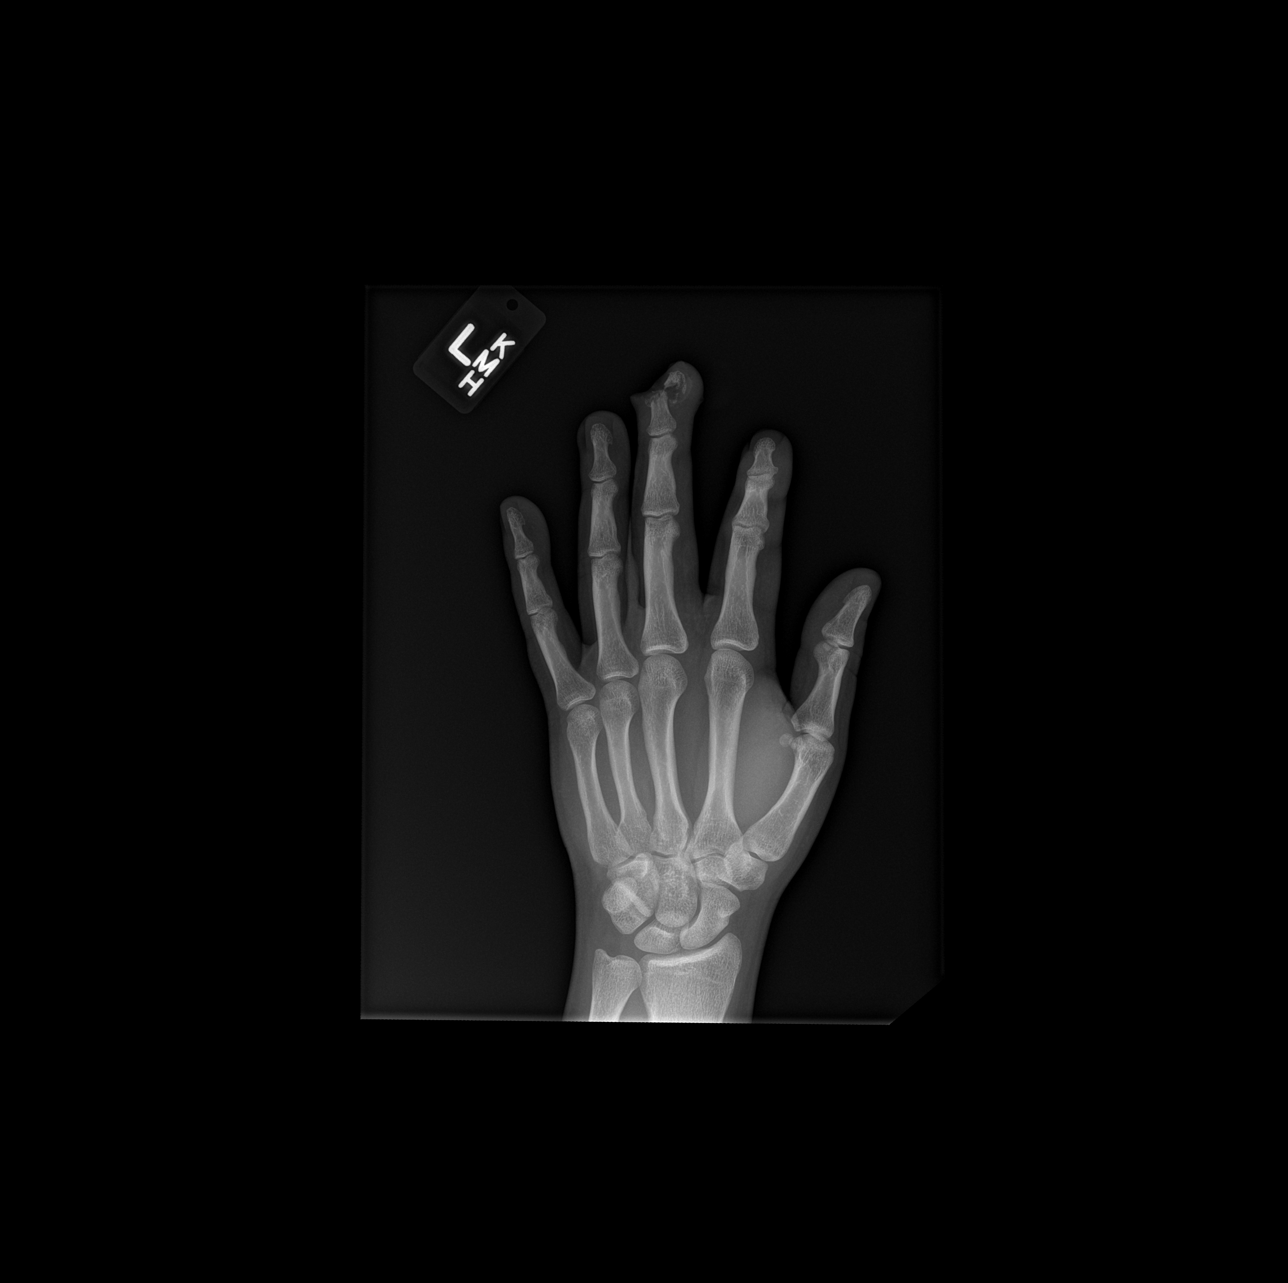

[x hand lat left]
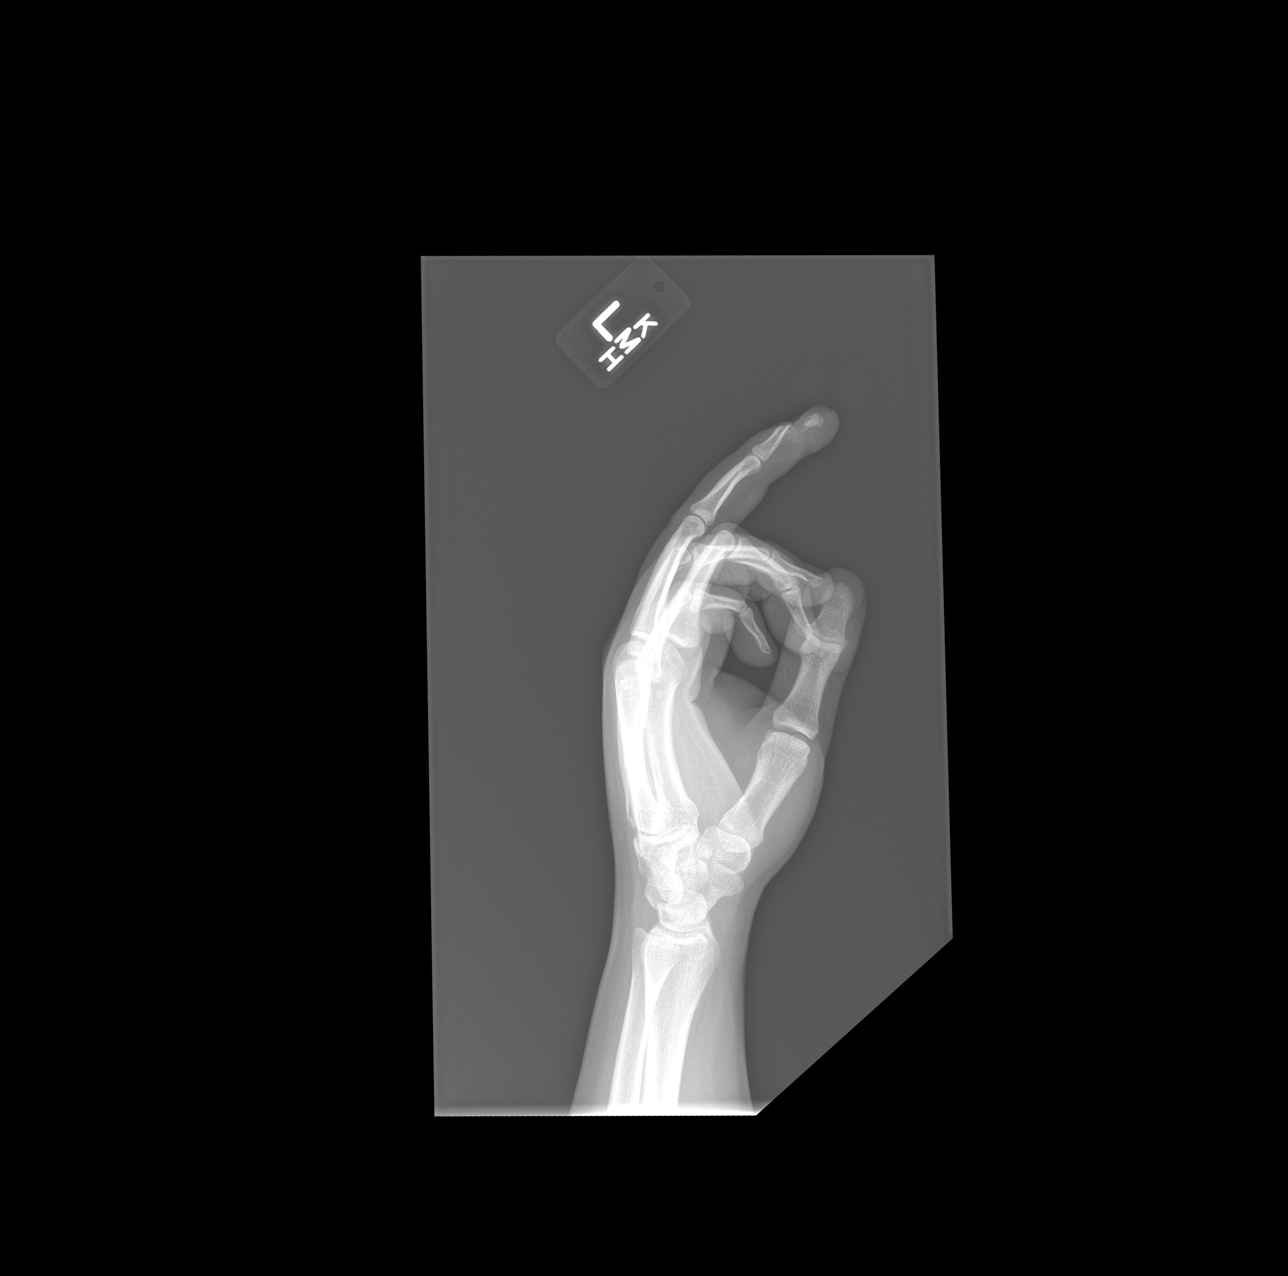

[3 of 3 positions shown; findings below may reference images not displayed]

FINDINGS: Severely comminuted fracture through the distal third left third
distal phalanx, with severe associated soft tissue injury.
Appearance of near amputation. Numerous small comminution fragments.
DIP preserved and intact.

No other osseous injury identified in the left hand. Distal left
radius and ulna intact.
IMPRESSION: Severely comminuted extra-articular fracture left third distal
phalanx with severe soft tissue injury; near traumatic amputation.

## 2014-12-26 ENCOUNTER — Other Ambulatory Visit: Payer: Self-pay | Admitting: Family Medicine

## 2015-01-14 ENCOUNTER — Other Ambulatory Visit (INDEPENDENT_AMBULATORY_CARE_PROVIDER_SITE_OTHER): Payer: BLUE CROSS/BLUE SHIELD

## 2015-01-14 DIAGNOSIS — R7989 Other specified abnormal findings of blood chemistry: Secondary | ICD-10-CM | POA: Diagnosis not present

## 2015-01-14 DIAGNOSIS — Z0000 Encounter for general adult medical examination without abnormal findings: Secondary | ICD-10-CM | POA: Diagnosis not present

## 2015-01-14 LAB — LDL CHOLESTEROL, DIRECT: Direct LDL: 131 mg/dL

## 2015-01-14 LAB — BASIC METABOLIC PANEL
BUN: 15 mg/dL (ref 6–23)
CO2: 28 meq/L (ref 19–32)
Calcium: 9.8 mg/dL (ref 8.4–10.5)
Chloride: 104 mEq/L (ref 96–112)
Creatinine, Ser: 1.05 mg/dL (ref 0.40–1.50)
GFR: 90.41 mL/min (ref >60.00)
Glucose, Bld: 93 mg/dL (ref 70–99)
POTASSIUM: 5 meq/L (ref 3.5–5.1)
Sodium: 143 mEq/L (ref 135–145)

## 2015-01-14 LAB — CBC WITH DIFFERENTIAL/PLATELET
BASOS ABS: 0 10*3/uL (ref 0.0–0.1)
BASOS PCT: 0.4 % (ref 0.0–3.0)
EOS ABS: 0.3 10*3/uL (ref 0.0–0.7)
Eosinophils Relative: 3.5 % (ref 0.0–5.0)
HCT: 48.6 % (ref 39.0–52.0)
HEMOGLOBIN: 16.4 g/dL (ref 13.0–17.0)
Lymphocytes Relative: 31.5 % (ref 12.0–46.0)
Lymphs Abs: 2.5 10*3/uL (ref 0.7–4.0)
MCHC: 33.7 g/dL (ref 30.0–36.0)
MCV: 87.4 fl (ref 78.0–100.0)
Monocytes Absolute: 0.6 10*3/uL (ref 0.1–1.0)
Monocytes Relative: 7.7 % (ref 3.0–12.0)
NEUTROS ABS: 4.5 10*3/uL (ref 1.4–7.7)
Neutrophils Relative %: 56.9 % (ref 43.0–77.0)
Platelets: 226 10*3/uL (ref 150.0–400.0)
RBC: 5.57 Mil/uL (ref 4.22–5.81)
RDW: 12.6 % (ref 11.5–15.5)
WBC: 7.9 10*3/uL (ref 4.0–10.5)

## 2015-01-14 LAB — LIPID PANEL
CHOL/HDL RATIO: 5
CHOLESTEROL: 241 mg/dL — AB (ref 0–200)
HDL: 44.2 mg/dL (ref >39.00)
NonHDL: 196.92
TRIGLYCERIDES: 364 mg/dL — AB (ref 0.0–149.0)
VLDL: 72.8 mg/dL — ABNORMAL HIGH (ref 0.0–40.0)

## 2015-01-14 LAB — HEPATIC FUNCTION PANEL
ALK PHOS: 60 U/L (ref 39–117)
ALT: 30 U/L (ref 0–53)
AST: 18 U/L (ref 0–37)
Albumin: 4.6 g/dL (ref 3.5–5.2)
BILIRUBIN DIRECT: 0.1 mg/dL (ref 0.0–0.3)
BILIRUBIN TOTAL: 0.4 mg/dL (ref 0.2–1.2)
Total Protein: 6.8 g/dL (ref 6.0–8.3)

## 2015-01-14 LAB — TSH: TSH: 3.17 u[IU]/mL (ref 0.35–4.50)

## 2015-01-21 ENCOUNTER — Encounter: Payer: BLUE CROSS/BLUE SHIELD | Admitting: Family Medicine

## 2015-01-21 ENCOUNTER — Telehealth: Payer: Self-pay | Admitting: Family Medicine

## 2015-01-21 NOTE — Telephone Encounter (Signed)
Refill for one month until follow up. 

## 2015-01-21 NOTE — Telephone Encounter (Signed)
Pt's mother called saying he's been out of Klonopin for four days. He couldn't make his appt today due to having a "major panic attack." He's wondering if a refill of the medication can be sent to [facility] [address] for him. He rescheduled his CPE to 1.5.17. Please call the pt or his mother if you have questions or concerns.  Pt's ph# [phone] Pt's mother, Erskine SquibbJane ph# [phone] Thank you.

## 2015-01-21 NOTE — Telephone Encounter (Signed)
Last fill was 12/26/2014 #60. Please advise.

## 2015-01-22 MED ORDER — CLONAZEPAM 0.5 MG PO TABS: 0.5000 mg | ORAL_TABLET | Freq: Two times a day (BID) | ORAL | Status: DC

## 2015-01-22 NOTE — Telephone Encounter (Signed)
Printed for signature

## 2015-01-22 NOTE — Telephone Encounter (Signed)
Pts mother is aware that RX has been faxed to pharmacy.

## 2015-02-14 ENCOUNTER — Ambulatory Visit (INDEPENDENT_AMBULATORY_CARE_PROVIDER_SITE_OTHER): Payer: BLUE CROSS/BLUE SHIELD | Admitting: Family Medicine

## 2015-02-14 ENCOUNTER — Encounter: Payer: Self-pay | Admitting: Family Medicine

## 2015-02-14 VITALS — BP 110/90 | HR 118 | Temp 98.6°F | Resp 16 | Ht 69.5 in | Wt 231.0 lb

## 2015-02-14 DIAGNOSIS — Z0000 Encounter for general adult medical examination without abnormal findings: Secondary | ICD-10-CM

## 2015-02-14 DIAGNOSIS — F410 Panic disorder [episodic paroxysmal anxiety] without agoraphobia: Secondary | ICD-10-CM | POA: Diagnosis not present

## 2015-02-14 MED ORDER — FLUOXETINE HCL 20 MG PO CAPS: 20.0000 mg | ORAL_CAPSULE | Freq: Every day | ORAL | Status: DC

## 2015-02-14 NOTE — Patient Instructions (Addendum)
Why follow it? Research shows. Those who follow the Mediterranean diet have a reduced risk of heart disease  The diet is associated with a reduced incidence of Parkinson's and Alzheimer's diseases People following the diet may have longer life expectancies and lower rates of chronic diseases  The Dietary Guidelines for Americans recommends the Mediterranean diet as an eating plan to promote health and prevent disease  What Is the Mediterranean Diet?  Healthy eating plan based on typical foods and recipes of Mediterranean-style cooking The diet is primarily a plant based diet; these foods should make up a majority of meals   Starches - Plant based foods should make up a majority of meals - They are an important sources of vitamins, minerals, energy, antioxidants, and fiber - Choose whole grains, foods high in fiber and minimally processed items  - Typical grain sources include wheat, oats, barley, corn, brown rice, bulgar, farro, millet, polenta, couscous  - Various types of beans include chickpeas, lentils, fava beans, black beans, white beans   Fruits  Veggies - Large quantities of antioxidant rich fruits & veggies; 6 or more servings  - Vegetables can be eaten raw or lightly drizzled with oil and cooked  - Vegetables common to the traditional Mediterranean Diet include: artichokes, arugula, beets, broccoli, brussel sprouts, cabbage, carrots, celery, collard greens, cucumbers, eggplant, kale, leeks, lemons, lettuce, mushrooms, okra, onions, peas, peppers, potatoes, pumpkin, radishes, rutabaga, shallots, spinach, sweet potatoes, turnips, zucchini - Fruits common to the Mediterranean Diet include: apples, apricots, avocados, cherries, clementines, dates, figs, grapefruits, grapes, melons, nectarines, oranges, peaches, pears, pomegranates, strawberries, tangerines  Fats - Replace butter and margarine with healthy oils, such as olive oil, canola oil, and tahini  - Limit nuts to no more than a  handful a day  - Nuts include walnuts, almonds, pecans, pistachios, pine nuts  - Limit or avoid candied, honey roasted or heavily salted nuts - Olives are central to the Mediterranean diet - can be eaten whole or used in a variety of dishes   Meats Protein - Limiting red meat: no more than a few times a month - When eating red meat: choose lean cuts and keep the portion to the size of deck of cards - Eggs: approx. 0 to 4 times a week  - Fish and lean poultry: at least 2 a week  - Healthy protein sources include, chicken, turkey, lean beef, lamb - Increase intake of seafood such as tuna, salmon, trout, mackerel, shrimp, scallops - Avoid or limit high fat processed meats such as sausage and bacon  Dairy - Include moderate amounts of low fat dairy products  - Focus on healthy dairy such as fat free yogurt, skim milk, low or reduced fat cheese - Limit dairy products higher in fat such as whole or 2% milk, cheese, ice cream  Alcohol - Moderate amounts of red wine is ok  - No more than 5 oz daily for women (all ages) and men older than age 65  - No more than 10 oz of wine daily for men younger than 65  Other - Limit sweets and other desserts  - Use herbs and spices instead of salt to flavor foods  - Herbs and spices common to the traditional Mediterranean Diet include: basil, bay leaves, chives, cloves, cumin, fennel, garlic, lavender, marjoram, mint, oregano, parsley, pepper, rosemary, sage, savory, sumac, tarragon, thyme   It's not just a diet, it's a lifestyle:  The Mediterranean diet includes lifestyle factors typical of those in the   region  Foods, drinks and meals are best eaten with others and savored Daily physical activity is important for overall good health This could be strenuous exercise like running and aerobics This could also be more leisurely activities such as walking, housework, yard-work, or taking the stairs Moderation is the key; a balanced and healthy diet accommodates most  foods and drinks Consider portion sizes and frequency of consumption of certain foods   Meal Ideas & Options:  Breakfast:  Whole wheat toast or whole wheat English muffins with peanut butter & hard boiled egg Steel cut oats topped with apples & cinnamon and skim milk  Fresh fruit: banana, strawberries, melon, berries, peaches  Smoothies: strawberries, bananas, greek yogurt, peanut butter Low fat greek yogurt with blueberries and granola  Egg white omelet with spinach and mushrooms Breakfast couscous: whole wheat couscous, apricots, skim milk, cranberries  Sandwiches:  Hummus and grilled vegetables (peppers, zucchini, squash) on whole wheat bread   Grilled chicken on whole wheat pita with lettuce, tomatoes, cucumbers or tzatziki  [place] salad on whole wheat bread: tuna salad made with greek yogurt, olives, red peppers, capers, green onions Garlic rosemary lamb pita: lamb sauted with garlic, rosemary, salt & pepper; add lettuce, cucumber, greek yogurt to pita - flavor with lemon juice and black pepper  Seafood:  Mediterranean grilled salmon, seasoned with garlic, basil, parsley, lemon juice and black pepper Shrimp, lemon, and spinach whole-grain pasta salad made with low fat greek yogurt  Seared scallops with lemon orzo  Seared tuna steaks seasoned salt, pepper, coriander topped with tomato mixture of olives, tomatoes, olive oil, minced garlic, parsley, green onions and cappers  Meats:  Herbed greek chicken salad with kalamata olives, cucumber, feta  Red bell peppers stuffed with spinach, bulgur, lean ground beef (or lentils) & topped with feta   Kebabs: skewers of chicken, tomatoes, onions, zucchini, squash  [place] burgers: made with red onions, mint, dill, lemon juice, feta cheese topped with roasted red peppers Vegetarian Cucumber salad: cucumbers, artichoke hearts, celery, red onion, feta cheese, tossed in olive oil & lemon juice  Hummus and whole grain pita points with a greek salad  (lettuce, tomato, feta, olives, cucumbers, red onion) Lentil soup with celery, carrots made with vegetable broth, garlic, salt and pepper  Tabouli salad: parsley, bulgur, mint, scallions, cucumbers, tomato, radishes, lemon juice, olive oil, salt and pepper.     Food Choices to Lower Your Triglycerides Triglycerides are a type of fat in your blood. High levels of triglycerides can increase the risk of heart disease and stroke. If your triglyceride levels are high, the foods you eat and your eating habits are very important. Choosing the right foods can help lower your triglycerides.  WHAT GENERAL GUIDELINES DO I NEED TO FOLLOW? Lose weight if you are overweight.  Limit or avoid alcohol.  Fill one half of your plate with vegetables and green salads.  Limit fruit to two servings a day. Choose fruit instead of juice.  Make one fourth of your plate whole grains. Look for the word "whole" as the first word in the ingredient list. Fill one fourth of your plate with lean protein foods. Enjoy fatty fish (such as salmon, mackerel, sardines, and tuna) three times a week.  Choose healthy fats.  Limit foods high in starch and sugar. Eat more home-cooked food and less restaurant, buffet, and fast food. Limit fried foods. Cook foods using methods other than frying. Limit saturated fats. Check ingredient lists to avoid foods with partially hydrogenated oils (  trans fats) in them. WHAT FOODS CAN I EAT?  Grains Whole grains, such as whole wheat or whole grain breads, crackers, cereals, and pasta. Unsweetened oatmeal, bulgur, barley, quinoa, or brown rice. Corn or whole wheat flour tortillas.  Vegetables Fresh or frozen vegetables (raw, steamed, roasted, or grilled). Green salads. Fruits All fresh, canned (in natural juice), or frozen fruits. Meat and Other Protein Products Ground beef (85% or leaner), grass-fed beef, or beef trimmed of fat. Skinless chicken or [place]. Ground chicken or [place]. Pork  trimmed of fat. All fish and seafood. Eggs. Dried beans, peas, or lentils. Unsalted nuts or seeds. Unsalted canned or dry beans. Dairy Low-fat dairy products, such as skim or 1% milk, 2% or reduced-fat cheeses, low-fat ricotta or cottage cheese, or plain low-fat yogurt. Fats and Oils Tub margarines without trans fats. Light or reduced-fat mayonnaise and salad dressings. Avocado. Safflower, olive, or canola oils. Natural peanut or almond butter. The items listed above may not be a complete list of recommended foods or beverages. Contact your dietitian for more options. WHAT FOODS ARE NOT RECOMMENDED?  Grains White bread. White pasta. White rice. Cornbread. Bagels, pastries, and croissants. Crackers that contain trans fat. Vegetables White potatoes. Corn. Creamed or fried vegetables. Vegetables in a cheese sauce. Fruits Dried fruits. Canned fruit in light or heavy syrup. Fruit juice. Meat and Other Protein Products Fatty cuts of meat. Ribs, chicken wings, bacon, sausage, bologna, salami, chitterlings, fatback, hot dogs, bratwurst, and packaged luncheon meats. Dairy Whole or 2% milk, cream, half-and-half, and cream cheese. Whole-fat or sweetened yogurt. Full-fat cheeses. Nondairy creamers and whipped toppings. Processed cheese, cheese spreads, or cheese curds. Sweets and Desserts Corn syrup, sugars, honey, and molasses. Candy. Jam and jelly. Syrup. Sweetened cereals. Cookies, pies, cakes, donuts, muffins, and ice cream. Fats and Oils Butter, stick margarine, lard, shortening, ghee, or bacon fat. Coconut, palm kernel, or palm oils. Beverages Alcohol. Sweetened drinks (such as sodas, lemonade, and fruit drinks or punches). The items listed above may not be a complete list of foods and beverages to avoid. Contact your dietitian for more information.   This information is not intended to replace advice given to you by your health care provider. Make sure you discuss any questions you have with your  health care provider.   Document Released: 11/14/2003 Document Revised: 02/16/2014 Document Reviewed: 11/30/2012 Elsevier Interactive Patient Education 2016 [employer] Fat and Cholesterol Restricted Diet High levels of fat and cholesterol in your blood may lead to various health problems, such as diseases of the heart, blood vessels, gallbladder, liver, and pancreas. Fats are concentrated sources of energy that come in various forms. Certain types of fat, including saturated fat, may be harmful in excess. Cholesterol is a substance needed by your body in small amounts. Your body makes all the cholesterol it needs. Excess cholesterol comes from the food you eat. When you have high levels of cholesterol and saturated fat in your blood, health problems can develop because the excess fat and cholesterol will gather along the walls of your blood vessels, causing them to narrow. Choosing the right foods will help you control your intake of fat and cholesterol. This will help keep the levels of these substances in your blood within normal limits and reduce your risk of disease. WHAT IS MY PLAN? Your health care provider recommends that you:  Get no more than __________ % of the total calories in your daily diet from fat.  Limit your intake of saturated fat to less than  ______% of your total calories each day.  Limit the amount of cholesterol in your diet to less than _________mg per day. WHAT TYPES OF FAT SHOULD I CHOOSE?  Choose healthy fats more often. Choose monounsaturated and polyunsaturated fats, such as olive and canola oil, flaxseeds, walnuts, almonds, and seeds.  Eat more omega-3 fats. Good choices include salmon, mackerel, sardines, tuna, flaxseed oil, and ground flaxseeds. Aim to eat fish at least two times a week.  Limit saturated fats. Saturated fats are primarily found in animal products, such as meats, butter, and cream. Plant sources of saturated fats include palm oil, palm kernel  oil, and coconut oil.  Avoid foods with partially hydrogenated oils in them. These contain trans fats. Examples of foods that contain trans fats are stick margarine, some tub margarines, cookies, crackers, and other baked goods. WHAT GENERAL GUIDELINES DO I NEED TO FOLLOW? These guidelines for healthy eating will help you control your intake of fat and cholesterol:  Check food labels carefully to identify foods with trans fats or high amounts of saturated fat.  Fill one half of your plate with vegetables and green salads.  Fill one fourth of your plate with whole grains. Look for the word "whole" as the first word in the ingredient list.  Fill one fourth of your plate with lean protein foods.  Limit fruit to two servings a day. Choose fruit instead of juice.  Eat more foods that contain soluble fiber. Examples of foods that contain this type of fiber are apples, broccoli, carrots, beans, peas, and barley. Aim to get 20-30 g of fiber per day.  Eat more home-cooked food and less restaurant, buffet, and fast food.  Limit or avoid alcohol.  Limit foods high in starch and sugar.  Limit fried foods.  Cook foods using methods other than frying. Baking, boiling, grilling, and broiling are all great options.  Lose weight if you are overweight. Losing just 5-10% of your initial body weight can help your overall health and prevent diseases such as diabetes and heart disease. WHAT FOODS CAN I EAT? Grains Whole grains, such as whole wheat or whole grain breads, crackers, cereals, and pasta. Unsweetened oatmeal, bulgur, barley, quinoa, or brown rice. Corn or whole wheat flour tortillas. Vegetables Fresh or frozen vegetables (raw, steamed, roasted, or grilled). Green salads. Fruits All fresh, canned (in natural juice), or frozen fruits. Meat and Other Protein Products Ground beef (85% or leaner), grass-fed beef, or beef trimmed of fat. Skinless chicken or [place]. Ground chicken or [place]. Pork  trimmed of fat. All fish and seafood. Eggs. Dried beans, peas, or lentils. Unsalted nuts or seeds. Unsalted canned or dry beans. Dairy Low-fat dairy products, such as skim or 1% milk, 2% or reduced-fat cheeses, low-fat ricotta or cottage cheese, or plain low-fat yogurt. Fats and Oils Tub margarines without trans fats. Light or reduced-fat mayonnaise and salad dressings. Avocado. Olive, canola, sesame, or safflower oils. Natural peanut or almond butter (choose ones without added sugar and oil). The items listed above may not be a complete list of recommended foods or beverages. Contact your dietitian for more options. WHAT FOODS ARE NOT RECOMMENDED? Grains White bread. White pasta. White rice. Cornbread. Bagels, pastries, and croissants. Crackers that contain trans fat. Vegetables White potatoes. Corn. Creamed or fried vegetables. Vegetables in a cheese sauce. Fruits Dried fruits. Canned fruit in light or heavy syrup. Fruit juice. Meat and Other Protein Products Fatty cuts of meat. Ribs, chicken wings, bacon, sausage, bologna, salami, chitterlings, fatback, hot  dogs, bratwurst, and packaged luncheon meats. Liver and organ meats. Dairy Whole or 2% milk, cream, half-and-half, and cream cheese. Whole milk cheeses. Whole-fat or sweetened yogurt. Full-fat cheeses. Nondairy creamers and whipped toppings. Processed cheese, cheese spreads, or cheese curds. Sweets and Desserts Corn syrup, sugars, honey, and molasses. Candy. Jam and jelly. Syrup. Sweetened cereals. Cookies, pies, cakes, donuts, muffins, and ice cream. Fats and Oils Butter, stick margarine, lard, shortening, ghee, or bacon fat. Coconut, palm kernel, or palm oils. Beverages Alcohol. Sweetened drinks (such as sodas, lemonade, and fruit drinks or punches). The items listed above may not be a complete list of foods and beverages to avoid. Contact your dietitian for more information.   This information is not intended to replace advice given  to you by your health care provider. Make sure you discuss any questions you have with your health care provider.   Document Released: 01/26/2005 Document Revised: 02/16/2014 Document Reviewed: 04/26/2013 Elsevier Interactive Patient Education 2016 [employer].  Start the prozac After about 3 weeks, try reducing the klonopin to once daily

## 2015-02-14 NOTE — Progress Notes (Signed)
Subjective:    Patient ID: Blake [name], male    DOB: 26-Jan-1991, 25 y.o.   MRN: [id number]  HPI  Patient here for physical. Chronic problems include history of obesity and chronic anxiety. Probable panic disorder. He tried a couple of SSRI medications in the past and either these did not help or he had side effects (felt lethargic). He recalls trying Lexapro and he thinks Zoloft. He is currently on Klonopin 0.5 mgs twice daily and would like to try to taper off this altogether. No history of misuse- although recently he has taken occasionally taken additional dose.  No consistent exercise. Nonsmoker. Declines flu vaccine. Tetanus up-to-date.  Past Medical History  Diagnosis Date  . ADD 09/21/2008  . GERD 09/14/2008  . HYPERHIDROSIS 09/14/2008  . Other seborrheic dermatitis 09/14/2008  . History of chicken pox   . Depression    No past surgical history on file.  reports that he has quit smoking. His smoking use included Cigarettes. He has a 2.5 pack-year smoking history. He does not have any smokeless tobacco history on file. He reports that he drinks alcohol. He reports that he does not use illicit drugs. family history includes Alcohol abuse in his other; Hyperlipidemia in his father and mother; Mental illness in his maternal uncle. No Known Allergies   Review of Systems  Constitutional: Negative for fever, activity change, appetite change and fatigue.  HENT: Negative for congestion, ear pain and trouble swallowing.   Eyes: Negative for pain and visual disturbance.  Respiratory: Negative for cough, shortness of breath and wheezing.   Cardiovascular: Negative for chest pain and palpitations.  Gastrointestinal: Negative for nausea, vomiting, abdominal pain, diarrhea, constipation, blood in stool, abdominal distention and rectal pain.  Genitourinary: Negative for dysuria, hematuria and testicular pain.  Musculoskeletal: Negative for joint swelling and arthralgias.  Skin: Negative  for rash.  Neurological: Negative for dizziness, syncope and headaches.  Hematological: Negative for adenopathy.  Psychiatric/Behavioral: Negative for confusion, dysphoric mood and agitation. The patient is nervous/anxious.        Objective:   Physical Exam  Constitutional: He is oriented to person, place, and time. He appears well-developed and well-nourished. No distress.  HENT:  Head: Normocephalic and atraumatic.  Right Ear: External ear normal.  Left Ear: External ear normal.  Mouth/Throat: Oropharynx is clear and moist.  Eyes: Conjunctivae and EOM are normal. Pupils are equal, round, and reactive to light.  Neck: Normal range of motion. Neck supple. No thyromegaly present.  Cardiovascular: Normal rate, regular rhythm and normal heart sounds.   No murmur heard. Pulmonary/Chest: No respiratory distress. He has no wheezes. He has no rales.  Abdominal: Soft. Bowel sounds are normal. He exhibits no distension and no mass. There is no tenderness. There is no rebound and no guarding.  Musculoskeletal: He exhibits no edema.  Lymphadenopathy:    He has no cervical adenopathy.  Neurological: He is alert and oriented to person, place, and time. He displays normal reflexes. No cranial nerve deficit.  Skin: No rash noted.  Psychiatric: He has a normal mood and affect.          Assessment & Plan:  #1 physical exam. Labs reviewed. He has dyslipidemia and we made some suggestions for weight loss, increased exercise, and dietary changes. Handouts given. He declines flu vaccine  #2 chronic anxiety. Patient requesting tapering off Klonopin. He does have history of reported panic disorder. We've recommended starting Prozac 20 mg once daily for 3 weeks then try tapering  Klonopin back to once daily and reassess in 6 weeks' time.

## 2015-02-14 NOTE — Progress Notes (Signed)
Pre visit review using our clinic review tool, if applicable. No additional management support is needed unless otherwise documented below in the visit note. 

## 2015-02-19 ENCOUNTER — Other Ambulatory Visit: Payer: Self-pay | Admitting: Family Medicine

## 2015-02-20 NOTE — Telephone Encounter (Signed)
Last visit was CPE on 02-14-2015 Refilled last 01/22/2015 Please advise

## 2015-02-20 NOTE — Telephone Encounter (Signed)
Refill once.  His plan is to try tapering off after being on the Prozac for a few weeks.

## 2015-02-20 NOTE — Telephone Encounter (Signed)
Amount was #60.  I did see where he just started Prozac and will start tapering off medication in approx 3 weeks after starting.

## 2015-02-22 ENCOUNTER — Other Ambulatory Visit: Payer: Self-pay | Admitting: Family Medicine

## 2015-03-27 ENCOUNTER — Encounter: Payer: Self-pay | Admitting: Family Medicine

## 2015-03-27 ENCOUNTER — Ambulatory Visit (INDEPENDENT_AMBULATORY_CARE_PROVIDER_SITE_OTHER): Payer: BLUE CROSS/BLUE SHIELD | Admitting: Family Medicine

## 2015-03-27 VITALS — BP 100/80 | HR 113 | Temp 98.9°F | Ht 69.5 in | Wt 226.4 lb

## 2015-03-27 DIAGNOSIS — F410 Panic disorder [episodic paroxysmal anxiety] without agoraphobia: Secondary | ICD-10-CM

## 2015-03-27 MED ORDER — ESCITALOPRAM OXALATE 10 MG PO TABS: 10.0000 mg | ORAL_TABLET | Freq: Every day | ORAL | Status: DC

## 2015-03-27 MED ORDER — CLONAZEPAM 0.5 MG PO TABS: 0.5000 mg | ORAL_TABLET | Freq: Two times a day (BID) | ORAL | Status: DC

## 2015-03-27 NOTE — Progress Notes (Signed)
   Subjective:    Patient ID: Blake [name], male    DOB: November 12, 1990, 25 y.o.   MRN: [id number]  HPI Follow-up anxiety. Suspected panic disorder. Refer to prior notes. He been on multiple SSRIs previously. Currently takes Klonopin 0.5 mg twice a day when necessary. We started Prozac and initially felt this was helping his anxiety symptoms but he's recently had some problems with insomnia with early morning awakening. Denies depressive symptoms. He feels like the Prozac may be making his anxiety worse versus poor sleep quality. No consistent alcohol use.  Past Medical History  Diagnosis Date  . ADD 09/21/2008  . GERD 09/14/2008  . HYPERHIDROSIS 09/14/2008  . Other seborrheic dermatitis 09/14/2008  . History of chicken pox   . Depression    No past surgical history on file.  reports that he has quit smoking. His smoking use included Cigarettes. He has a 2.5 pack-year smoking history. He does not have any smokeless tobacco history on file. He reports that he drinks alcohol. He reports that he does not use illicit drugs. family history includes Alcohol abuse in his other; Hyperlipidemia in his father and mother; Mental illness in his maternal uncle. No Known Allergies    Review of Systems  Neurological: Negative for weakness.  Psychiatric/Behavioral: Positive for sleep disturbance. Negative for agitation. The patient is nervous/anxious.        Objective:   Physical Exam  Constitutional: He appears well-developed and well-nourished.  Cardiovascular: Normal rate and regular rhythm.   Pulmonary/Chest: Effort normal and breath sounds normal. No respiratory distress. He has no wheezes. He has no rales.  Psychiatric: He has a normal mood and affect. His behavior is normal. Judgment and thought content normal.          Assessment & Plan:  Chronic anxiety. Suspected panic disorder. Possible side effect with fluoxetine with insomnia. He will discontinue fluoxetine. Continue Klonopin as  needed. Start Lexapro 10 mg once daily. Reassess in one month

## 2015-03-27 NOTE — Patient Instructions (Signed)
Stop the Fluoxetine Start the Lexapro one daily- about 5-6 days

## 2015-03-27 NOTE — Progress Notes (Signed)
Pre visit review using our clinic review tool, if applicable. No additional management support is needed unless otherwise documented below in the visit note. 

## 2015-04-24 ENCOUNTER — Encounter: Payer: Self-pay | Admitting: Family Medicine

## 2015-04-24 ENCOUNTER — Ambulatory Visit (INDEPENDENT_AMBULATORY_CARE_PROVIDER_SITE_OTHER): Payer: BLUE CROSS/BLUE SHIELD | Admitting: Family Medicine

## 2015-04-24 VITALS — BP 100/78 | HR 93 | Temp 98.2°F | Ht 69.5 in | Wt 231.4 lb

## 2015-04-24 DIAGNOSIS — F410 Panic disorder [episodic paroxysmal anxiety] without agoraphobia: Secondary | ICD-10-CM

## 2015-04-24 NOTE — Progress Notes (Signed)
   Subjective:    Patient ID: Blake [name], male    DOB: 10/01/1990, 25 y.o.   MRN: [id number]  HPI Patient seen for follow-up regarding panic disorder. Refer to prior note. We had recently tried fluoxetine but he had problems with increased anxiety and insomnia. He previously tried sertraline without much success. We started Lexapro 10 mg once daily and he is seeing great improvement. He has not had any side effects. Anxiety symptoms are much improved. He's having both decrease frequency and severity of anxiety symptoms  He has been able to taper his Klonopin down to 1 tablet at night. He hopes to taper this off completely soon.  Past Medical History  Diagnosis Date  . ADD 09/21/2008  . GERD 09/14/2008  . HYPERHIDROSIS 09/14/2008  . Other seborrheic dermatitis 09/14/2008  . History of chicken pox   . Depression    No past surgical history on file.  reports that he has quit smoking. His smoking use included Cigarettes. He has a 2.5 pack-year smoking history. He does not have any smokeless tobacco history on file. He reports that he drinks alcohol. He reports that he does not use illicit drugs. family history includes Alcohol abuse in his other; Hyperlipidemia in his father and mother; Mental illness in his maternal uncle. No Known Allergies    Review of Systems  Constitutional: Negative for fatigue and unexpected weight change.  Eyes: Negative for visual disturbance.  Respiratory: Negative for cough, chest tightness and shortness of breath.   Cardiovascular: Negative for chest pain, palpitations and leg swelling.  Endocrine: Negative for polydipsia and polyuria.  Neurological: Negative for dizziness, syncope, weakness, light-headedness and headaches.       Objective:   Physical Exam  Constitutional: He appears well-developed and well-nourished.  Neck: Neck supple. No thyromegaly present.  Cardiovascular: Normal rate and regular rhythm.   No murmur heard. Pulmonary/Chest: Effort  normal and breath sounds normal. No respiratory distress. He has no wheezes. He has no rales.  Lymphadenopathy:    He has no cervical adenopathy.  Psychiatric: He has a normal mood and affect. His behavior is normal. Judgment and thought content normal.          Assessment & Plan:   Panic disorder. Improved on Lexapro. He feels that current dose has helped tremendously. Continue to taper off Klonopin if possible.

## 2015-04-24 NOTE — Progress Notes (Signed)
Pre visit review using our clinic review tool, if applicable. No additional management support is needed unless otherwise documented below in the visit note. 

## 2016-01-14 DIAGNOSIS — Z23 Encounter for immunization: Secondary | ICD-10-CM | POA: Diagnosis not present

## 2016-08-14 ENCOUNTER — Encounter: Payer: BLUE CROSS/BLUE SHIELD | Admitting: Family Medicine

## 2016-08-14 ENCOUNTER — Telehealth: Payer: Self-pay

## 2016-08-14 DIAGNOSIS — Z0289 Encounter for other administrative examinations: Secondary | ICD-10-CM

## 2016-08-14 NOTE — Telephone Encounter (Signed)
Patient's mother called to state that he is refusing to come in for his appointment today. She states that pt has been very depressed lately and that for the past few weeks he has mainly stayed in bed, he does occasionally get up to watch TV or eat. He has been crying a lot. She does not report any SI/HI issues. She says that he has a significant lack of motivation, even in things that used to interest him such as working on cars. He has become very defiant and disengaged with his family. She is not sure what to do for him. He has not been to see psych in several years and states that he does not want to be put on any medications.She does not feel that he needs any immediate interventions, they are just concerned about him.  I advised her that he will likely need to be seen, as we have not seen him in >2711yr, and that psych referral could be discussed at that time.   Dr. Caryl NeverBurchette - Please advise. Thanks!

## 2016-08-14 NOTE — Telephone Encounter (Signed)
He was scheduled for CPE today and did not show.  Really needs to be seen.  See if he is willing to follow up here next week.

## 2016-08-14 NOTE — Telephone Encounter (Signed)
Spoke with pt's mom. She states that pt has agreed to come in to office next week, preferably late afternoon. Scheduled pt to be seen 7/10 afternoon. Mother agrees. Advised her to contact office if they need anything sooner.

## 2016-08-18 ENCOUNTER — Ambulatory Visit (INDEPENDENT_AMBULATORY_CARE_PROVIDER_SITE_OTHER): Payer: BLUE CROSS/BLUE SHIELD | Admitting: Family Medicine

## 2016-08-18 ENCOUNTER — Encounter: Payer: Self-pay | Admitting: Family Medicine

## 2016-08-18 VITALS — BP 110/80 | HR 108 | Temp 98.5°F | Wt 227.7 lb

## 2016-08-18 DIAGNOSIS — F419 Anxiety disorder, unspecified: Secondary | ICD-10-CM

## 2016-08-18 DIAGNOSIS — F410 Panic disorder [episodic paroxysmal anxiety] without agoraphobia: Secondary | ICD-10-CM | POA: Diagnosis not present

## 2016-08-18 DIAGNOSIS — F32A Depression, unspecified: Secondary | ICD-10-CM

## 2016-08-18 DIAGNOSIS — F329 Major depressive disorder, single episode, unspecified: Secondary | ICD-10-CM | POA: Diagnosis not present

## 2016-08-18 MED ORDER — ESCITALOPRAM OXALATE 10 MG PO TABS: 10.0000 mg | ORAL_TABLET | Freq: Every day | ORAL | 5 refills | Status: DC

## 2016-08-18 NOTE — Patient Instructions (Signed)
We will set up psychiatry referral Please let me know if you feel depression symptoms worsening.

## 2016-08-18 NOTE — Progress Notes (Signed)
Subjective:     Patient ID: Blake [name], male   DOB: 1990-12-01, 26 y.o.   MRN: [id number]  HPI Patient seen for anxiety and depression issues. He has long-standing history of anxiety with diagnosis of panic disorder. He has taken serotonin re-uptake inhibitors in the past which have helped. He has been on Lexapro 10 mg daily but took himself off apparently within the past year. He denied any side effects. He fell stable at the time. He's tried multiple other previous medications including Prozac which he thinks causes insomnia and recalls trying Zoloft and Celexa the past but felt did not help as much as Lexapro.  He's more recently noted some increased depression symptoms. Low motivation. Loss of interest in hobbies and activities. Denies any suicidal ideation. Has frequent sleep difficulties. Denies any periods of high energy or impulsive behavior. Apparently had been seen by psychiatrist many years ago-reportedly more for his anxiety symptoms. Denies any illicit drug use. No regular alcohol use.  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 Denies any recent specific stressors  Past Medical History:  Diagnosis Date  . ADD 09/21/2008  . Depression   . GERD 09/14/2008  . History of chicken pox   . HYPERHIDROSIS 09/14/2008  . Other seborrheic dermatitis 09/14/2008   No past surgical history on file.  reports that he has quit smoking. His smoking use included Cigarettes. He has a 2.50 pack-year smoking history. He has never used smokeless tobacco. He reports that he drinks alcohol. He reports that he does not use drugs. family history includes Alcohol abuse in his other; Hyperlipidemia in his father and mother; Mental illness in his maternal uncle. No Known Allergies   Review of Systems  Constitutional: Negative for appetite change and unexpected weight change.  Respiratory: Negative for shortness of breath.   Cardiovascular: Negative for chest pain.  Gastrointestinal:  Negative for abdominal pain.  Neurological: Negative for headaches.  Psychiatric/Behavioral: Positive for dysphoric mood and sleep disturbance. Negative for agitation, confusion, hallucinations and suicidal ideas. The patient is nervous/anxious.        Objective:   Physical Exam  Constitutional: He appears well-developed and well-nourished.  Neck: Neck supple. No thyromegaly present.  Cardiovascular: Normal rate and regular rhythm.   Pulmonary/Chest: Effort normal and breath sounds normal. No respiratory distress. He has no wheezes. He has no rales.  Psychiatric:  PH Q-9 score of 14. Mood disorders questionnaire 4 out of 13 positive responses in first section       Assessment:     Patient presents with depression and anxiety symptoms. Past history of reported panic disorder. Progressive anxiety and depression over the past several weeks. No suicidal ideation. PH Q and mood disorders questionnaire results as above    Plan:     -Start back Lexapro 10 mg once daily which has helped in the past -We discussed possible psychiatry referral and patient would like to pursue that -We recommended he follow-up with the next few weeks. We will see if we get him into psychiatry first  Blake CoveyBruce W Adlynn Lowenstein MD [facility]

## 2016-08-20 ENCOUNTER — Telehealth: Payer: Self-pay

## 2016-08-20 NOTE — Telephone Encounter (Signed)
Message noted at 6 PM. Dr. Caryl NeverBurchette back tomorrow AM and he can address. Sheena- would touch base with Dr. Caryl NeverBurchette if you have not heard by noon as he will have a rather filled inbasket tomorrow morning.

## 2016-08-20 NOTE — Telephone Encounter (Signed)
Patient's mother called to report that pt is not improving. He states that the Lexapro makes him jittery and anxious. He is now seeming to have panic attacks (fast heart rate and rapid/shallow breathing), is continuing to cry a lot and does not want to get out of bed. She states that he denies SI/HI thoughts. She states that he is asking for Klonopin instead of the Lexapro "becuase that is the only thing that has worked for him in the past". Advised mom that Dr. Caryl NeverBurchette is not in the office today but would try to get recommendations from another provider. They have not heard from [facility] about the referral yet.   Dr. Durene CalHunter - Please advise in Dr. Lucie LeatherBurchette's absence, if you can. Mom advised that message may not be answered until Dr. Caryl NeverBurchette returns tomorrow.Thanks!

## 2016-08-21 MED ORDER — CLONAZEPAM 0.5 MG PO TABS: 0.5000 mg | ORAL_TABLET | Freq: Three times a day (TID) | ORAL | 0 refills | Status: DC | PRN

## 2016-08-21 NOTE — Telephone Encounter (Signed)
First, we don't expect the Lexapro to help yet (for depression or anxiety) since he just started.  He has gotten some benefit in past. If he has any suicidal ideation, we need immediate follow up.  Also, let me know if they have not heard back from psychiatry group by next week.  We can refill short supply of Klonopin until he sees psychiatry.  Klonopin 0.5 mg one po q 8 hours prn severe anxiety.  #60  0 refills.

## 2016-08-21 NOTE — Telephone Encounter (Signed)
LMTCB

## 2016-08-21 NOTE — Telephone Encounter (Signed)
Spoke with mom and gave recommendations. She states that she was able to get pt up out of bed yesterday, he took a shower and rode with her in the car to run an errand. He did eat supper with his family and then went back to bed. She states that he was crying again last night and seems frustrated that he feels this way. Advised mom to keep trying to get pt up and out, any small errands or doing something fun like going to the movies. Prescription called in to pharmacy. Mom advised if anything changes with pt, such as SI/HI, to take pt to ED immediately. She will call us back if she has not heard from psych referral by end of next week. Also advised that pt needs to use Klonopin sparingly as it must last him until psych eval. She will try and monitor medication usage. Nothing further needed at this time.

## 2016-08-26 ENCOUNTER — Telehealth: Payer: Self-pay | Admitting: Family Medicine

## 2016-08-26 NOTE — Telephone Encounter (Signed)
Spoke to [facility] to discuss referral process; Psych office reports patient must call to self schedule. Contacted patient's mother Erskine SquibbJane ([place]) advised of instructions and provided phone number for patient to call to self schedule.

## 2016-08-26 NOTE — Telephone Encounter (Signed)
Pt needs a referral to [facility] for depression

## 2016-09-07 DIAGNOSIS — F331 Major depressive disorder, recurrent, moderate: Secondary | ICD-10-CM | POA: Diagnosis not present

## 2016-10-05 DIAGNOSIS — F331 Major depressive disorder, recurrent, moderate: Secondary | ICD-10-CM | POA: Diagnosis not present

## 2016-10-26 ENCOUNTER — Ambulatory Visit (INDEPENDENT_AMBULATORY_CARE_PROVIDER_SITE_OTHER): Payer: BLUE CROSS/BLUE SHIELD | Admitting: Adult Health

## 2016-10-26 VITALS — BP 116/84 | HR 89 | Temp 98.4°F | Wt 239.0 lb

## 2016-10-26 DIAGNOSIS — R05 Cough: Secondary | ICD-10-CM

## 2016-10-26 DIAGNOSIS — R059 Cough, unspecified: Secondary | ICD-10-CM

## 2016-10-26 MED ORDER — BENZONATATE 200 MG PO CAPS: 200.0000 mg | ORAL_CAPSULE | Freq: Three times a day (TID) | ORAL | 1 refills | Status: DC | PRN

## 2016-10-26 NOTE — Progress Notes (Signed)
Subjective:    Patient ID: Blake [name], male    DOB: 12/20/1990, 26 y.o.   MRN: [id number]  HPI  26 year old male who  has a past medical history of ADD (09/21/2008); Depression; GERD (09/14/2008); History of chicken pox; HYPERHIDROSIS (09/14/2008); and Other seborrheic dermatitis (09/14/2008). He presents to the office today with the complaint of dry cough x 3-4 days. He denies any fevers but did have sinus pain and pressure over the last three days ( has since resolved).   He has been using various OTC cough medication which seem to help but do not last long    Review of Systems See HPI   Past Medical History:  Diagnosis Date  . ADD 09/21/2008  . Depression   . GERD 09/14/2008  . History of chicken pox   . HYPERHIDROSIS 09/14/2008  . Other seborrheic dermatitis 09/14/2008    Social History   Social History  . Marital status: Single    Spouse name: N/A  . Number of children: N/A  . Years of education: N/A   Occupational History  . Not on file.   Social History Main Topics  . Smoking status: Former Smoker    Packs/day: 0.50    Years: 5.00    Types: Cigarettes  . Smokeless tobacco: Never Used     Comment: E-Cig  . Alcohol use Yes  . Drug use: No  . Sexual activity: Not on file   Other Topics Concern  . Not on file   Social History Narrative  . No narrative on file    No past surgical history on file.  Family History  Problem Relation Age of Onset  . Hyperlipidemia Mother   . Hyperlipidemia Father   . Mental illness Maternal Uncle   . Alcohol abuse Other        uncle    No Known Allergies  Current Outpatient Prescriptions on File Prior to Visit  Medication Sig Dispense Refill  . clonazePAM (KLONOPIN) 0.5 MG tablet Take 1 tablet (0.5 mg total) by mouth every 8 (eight) hours as needed for anxiety. 60 tablet 0  . doxylamine, Sleep, (UNISOM) 25 MG tablet Take 25 mg by mouth at bedtime as needed.     No current facility-administered medications on file prior  to visit.     BP 116/84 (BP Location: Left Arm)   Pulse 89   Temp 98.4 F (36.9 C) (Oral)   Wt 239 lb (108.4 kg)   SpO2 98%   BMI 34.79 kg/m       Objective:   Physical Exam  Constitutional: He is oriented to person, place, and time. He appears well-developed and well-nourished. No distress.  HENT:  Head: Normocephalic and atraumatic.  Right Ear: External ear normal.  Left Ear: External ear normal.  Nose: Nose normal.  Mouth/Throat: Oropharynx is clear and moist. No oropharyngeal exudate.  Eyes: Pupils are equal, round, and reactive to light. Conjunctivae and EOM are normal.  Neck: Normal range of motion. Neck supple.  Cardiovascular: Normal rate, regular rhythm, normal heart sounds and intact distal pulses.  Exam reveals no gallop and no friction rub.   No murmur heard. Pulmonary/Chest: Breath sounds normal. No respiratory distress. He has no wheezes. He exhibits no tenderness.  Neurological: He is alert and oriented to person, place, and time.  Skin: Skin is warm and dry. No rash noted. No erythema. No pallor.  Psychiatric: He has a normal mood and affect. His behavior is normal. Judgment and  thought content normal.  Nursing note and vitals reviewed.     Assessment & Plan:  1. Cough - Likely viral in nature. No concern for PNA, asthma exacerbation, or bronchitis. Will prescribe Tessalon pearls. He can also use Mucinex as needed - benzonatate (TESSALON) 200 MG capsule; Take 1 capsule (200 mg total) by mouth 3 (three) times daily as needed for cough.  Dispense: 20 capsule; Refill: 1  Shirline Frees, NP

## 2016-10-27 DIAGNOSIS — F331 Major depressive disorder, recurrent, moderate: Secondary | ICD-10-CM | POA: Diagnosis not present

## 2016-10-29 ENCOUNTER — Encounter: Payer: Self-pay | Admitting: Family Medicine

## 2016-11-24 DIAGNOSIS — F331 Major depressive disorder, recurrent, moderate: Secondary | ICD-10-CM | POA: Diagnosis not present

## 2016-12-22 DIAGNOSIS — F331 Major depressive disorder, recurrent, moderate: Secondary | ICD-10-CM | POA: Diagnosis not present

## 2017-01-12 DIAGNOSIS — F331 Major depressive disorder, recurrent, moderate: Secondary | ICD-10-CM | POA: Diagnosis not present

## 2017-02-15 DIAGNOSIS — F331 Major depressive disorder, recurrent, moderate: Secondary | ICD-10-CM | POA: Diagnosis not present

## 2017-03-15 DIAGNOSIS — F331 Major depressive disorder, recurrent, moderate: Secondary | ICD-10-CM | POA: Diagnosis not present

## 2017-04-19 DIAGNOSIS — F331 Major depressive disorder, recurrent, moderate: Secondary | ICD-10-CM | POA: Diagnosis not present

## 2017-05-17 DIAGNOSIS — F331 Major depressive disorder, recurrent, moderate: Secondary | ICD-10-CM | POA: Diagnosis not present

## 2017-06-28 DIAGNOSIS — F331 Major depressive disorder, recurrent, moderate: Secondary | ICD-10-CM | POA: Diagnosis not present

## 2017-07-19 DIAGNOSIS — F331 Major depressive disorder, recurrent, moderate: Secondary | ICD-10-CM | POA: Diagnosis not present

## 2017-08-09 DIAGNOSIS — F331 Major depressive disorder, recurrent, moderate: Secondary | ICD-10-CM | POA: Diagnosis not present

## 2017-09-06 DIAGNOSIS — F331 Major depressive disorder, recurrent, moderate: Secondary | ICD-10-CM | POA: Diagnosis not present

## 2017-10-18 DIAGNOSIS — F331 Major depressive disorder, recurrent, moderate: Secondary | ICD-10-CM | POA: Diagnosis not present

## 2017-12-05 ENCOUNTER — Encounter: Payer: Self-pay | Admitting: Emergency Medicine

## 2017-12-05 DIAGNOSIS — F329 Major depressive disorder, single episode, unspecified: Secondary | ICD-10-CM | POA: Insufficient documentation

## 2017-12-05 DIAGNOSIS — F411 Generalized anxiety disorder: Secondary | ICD-10-CM | POA: Insufficient documentation

## 2017-12-13 ENCOUNTER — Ambulatory Visit (INDEPENDENT_AMBULATORY_CARE_PROVIDER_SITE_OTHER): Payer: BLUE CROSS/BLUE SHIELD | Admitting: Psychiatry

## 2017-12-13 DIAGNOSIS — F411 Generalized anxiety disorder: Secondary | ICD-10-CM | POA: Diagnosis not present

## 2017-12-13 DIAGNOSIS — F329 Major depressive disorder, single episode, unspecified: Secondary | ICD-10-CM | POA: Diagnosis not present

## 2017-12-13 MED ORDER — CLONAZEPAM 0.5 MG PO TABS: 0.5000 mg | ORAL_TABLET | Freq: Three times a day (TID) | ORAL | 1 refills | Status: DC | PRN

## 2017-12-13 NOTE — Progress Notes (Signed)
[facility]  Patient ID: WEBBER MICHIELS,  MRN: [id number]  PCP: Kristian Covey, MD  Date of Evaluation: 12/13/2017 Time spent:20 minutes  Chief Complaint:   HISTORY/CURRENT STATUS: HPI patient seen 10/18/2017.  Still some anxiety and depression though anxiety is better.  She wanted to stay at same medications.  He declined Viibryd. Today patient feels he is doing the same.  He eats okay.  He has sleep onset problems.  Denies suicidal thoughts.  Denies panic attacks.  Individual Medical History/ Review of Systems: Changes? :No   Allergies: Patient has no known allergies.  Current Medications:  Current Outpatient Medications:  .  clonazePAM (KLONOPIN) 0.5 MG tablet, Take 1 tablet (0.5 mg total) by mouth every 8 (eight) hours as needed for anxiety., Disp: 90 tablet, Rfl: 1 .  doxylamine, Sleep, (UNISOM) 25 MG tablet, Take 25 mg by mouth at bedtime as needed., Disp: , Rfl:  Medication Side Effects: none  Family Medical/ Social History: Changes? no  MENTAL HEALTH EXAM:  There were no vitals taken for this visit.There is no height or weight on file to calculate BMI.  General Appearance: Casual  Eye Contact:  good  Speech:  Normal Rate  Volume:  Normal  Mood:  Depressed  Affect:  Appropriate  Thought Process:  Linear  Orientation:  Full (Time, Place, and Person)  Thought Content: Logical   Suicidal Thoughts:  No  Homicidal Thoughts:  No  Memory:  WNL  Judgement:  Good  Insight:  Good  Psychomotor Activity:  Normal  Concentration:  Concentration: Good  Recall:  Good  Fund of Knowledge: Good  Language: Good  Assets:  Desire for Improvement  ADL's:  Intact  Cognition: WNL  Prognosis:  Good    DIAGNOSES:    ICD-10-CM   1. Major depressive disorder with current active episode, unspecified depression episode severity, unspecified whether recurrent F32.9   2. Anxiety state F41.1     Receiving Psychotherapy: No     RECOMMENDATIONS: Patient is doing  the same.  Requests same medications.  Has had problems in the past with SSRIs.  Will return in 2 months.  The medicine is on his Klonopin 0.5 mg twice daily to 3 times daily   [employer], PA-C

## 2018-01-31 ENCOUNTER — Telehealth: Payer: Self-pay | Admitting: Psychiatry

## 2018-01-31 NOTE — Telephone Encounter (Signed)
[facility] order.

## 2018-01-31 NOTE — Telephone Encounter (Signed)
[facility] for [facility]. [facility] and the last RX they filled was for #75 on Dec 4 from an original RX date of 11/07/17.  Please [facility] [address]

## 2018-02-14 ENCOUNTER — Ambulatory Visit (INDEPENDENT_AMBULATORY_CARE_PROVIDER_SITE_OTHER): Payer: BLUE CROSS/BLUE SHIELD | Admitting: Psychiatry

## 2018-02-14 DIAGNOSIS — F411 Generalized anxiety disorder: Secondary | ICD-10-CM

## 2018-02-14 DIAGNOSIS — F329 Major depressive disorder, single episode, unspecified: Secondary | ICD-10-CM | POA: Diagnosis not present

## 2018-02-14 MED ORDER — CLONAZEPAM 0.5 MG PO TABS: 0.5000 mg | ORAL_TABLET | Freq: Three times a day (TID) | ORAL | 1 refills | Status: DC | PRN

## 2018-02-14 NOTE — Progress Notes (Signed)
[facility]  Patient ID: Blake [name],  MRN: [id number]  PCP: Kristian Covey, MD  Date of Evaluation: 02/14/2018 Time spent:20 minutes  Chief Complaint:   HISTORY/CURRENT STATUS: HPI patient last seen 12/13/2017.  Patient is doing the same at this time with diagnoses of anxiety and depression.  Patient did not want to start a SSRI.  He has had bad reactions to them in the past.  He want to stay the same so we did Klonopin 0.5  2-3 times a day.  Individual Medical History/ Review of Systems: Changes? :No   Allergies: Patient has no known allergies.  Current Medications:  Current Outpatient Medications:  .  clonazePAM (KLONOPIN) 0.5 MG tablet, Take 1 tablet (0.5 mg total) by mouth every 8 (eight) hours as needed for anxiety., Disp: 90 tablet, Rfl: 1 .  doxylamine, Sleep, (UNISOM) 25 MG tablet, Take 25 mg by mouth at bedtime as needed., Disp: , Rfl:  Medication Side Effects: none  Family Medical/ Social History: Changes? none  MENTAL HEALTH EXAM:  There were no vitals taken for this visit.There is no height or weight on file to calculate BMI.  General Appearance: Casual  Eye Contact:  Good  Speech:  Normal Rate  Volume:  Normal  Mood:  Anxious and Depressed  Affect:  Appropriate  Thought Process:  Linear  Orientation:  Full (Time, Place, and Person)  Thought Content: Logical   Suicidal Thoughts:  No  Homicidal Thoughts:  No  Memory:  WNL  Judgement:  Good  Insight:  Good  Psychomotor Activity:  Normal  Concentration:  Concentration: Good  Recall:  Good  Fund of Knowledge: Good  Language: Good  Assets:  Desire for Improvement  ADL's:  Intact  Cognition: WNL  Prognosis:  Good    DIAGNOSES: No diagnosis found.  Receiving Psychotherapy: No    RECOMMENDATIONS: Patient requesting stay on Klonopin 0.5 3 times daily for the next 1 to 2 months this is reasonable.  He does want to taper off of it at some point.  He does not want to take SSRIs.  We  could consider Cymbalta or Effexor. Revisit 2 months. He lost his grandfather unexpectedly since last visit. We will follow his sleep onset problems   Anne Fu, PA-C

## 2018-04-15 ENCOUNTER — Ambulatory Visit (INDEPENDENT_AMBULATORY_CARE_PROVIDER_SITE_OTHER): Payer: BLUE CROSS/BLUE SHIELD | Admitting: Psychiatry

## 2018-04-15 DIAGNOSIS — F410 Panic disorder [episodic paroxysmal anxiety] without agoraphobia: Secondary | ICD-10-CM

## 2018-04-15 NOTE — Progress Notes (Signed)
[facility]  Patient ID: Blake [name],  MRN: [id number]  PCP: Blake Covey, MD  Date of Evaluation: 04/15/2018 Time spent:20 minutes  Chief Complaint:   HISTORY/CURRENT STATUS: HPI seen back in January 16 he has done well  Individual Medical History/ Review of Systems: Changes? :No   Allergies: Patient has no known allergies.  Current Medications:  Current Outpatient Medications:  .  clonazePAM (KLONOPIN) 0.5 MG tablet, Take 1 tablet (0.5 mg total) by mouth every 8 (eight) hours as needed for anxiety., Disp: 90 tablet, Rfl: 1 .  doxylamine, Sleep, (UNISOM) 25 MG tablet, Take 25 mg by mouth at bedtime as needed., Disp: , Rfl:  Medication Side Effects: none  Family Medical/ Social History: neg.   MENTAL HEALTH EXAM:  There were no vitals taken for this visit.There is no height or weight on file to calculate BMI.  General Appearance: Casual  Eye Contact:  Good  Speech:  Clear and Coherent and Normal Rate  Volume:  Normal  Mood:  Negative  Affect:  Congruent  Thought Process:  Linear  Orientation:  Full (Time, Place, and Person)  Thought Content: Logical   Suicidal Thoughts:  No  Homicidal Thoughts:  No  Memory:  WNL  Judgement:  Good  Insight:  Good  Psychomotor Activity:  NA and Normal  Concentration:  Concentration: Good  Recall:  Good  Fund of Knowledge: Good  Language: Good  Assets:  Desire for Improvement  ADL's:  Intact  Cognition: WNL  Prognosis:  Good    DIAGNOSES: No diagnosis found.  Receiving Psychotherapy: No    RECOMMENDATIONS: Last seen 02/24/2018.  He was doing well does have some anxiety.  Work is going well is able to focus on this   [employer], [employer]

## 2018-05-09 ENCOUNTER — Other Ambulatory Visit: Payer: Self-pay | Admitting: Psychiatry

## 2018-05-10 NOTE — Telephone Encounter (Signed)
Too soon just filled 04/25/2018 #[phone]

## 2018-07-14 ENCOUNTER — Telehealth: Payer: Self-pay | Admitting: Psychiatry

## 2018-07-14 ENCOUNTER — Other Ambulatory Visit: Payer: Self-pay

## 2018-07-14 MED ORDER — CLONAZEPAM 0.5 MG PO TABS: 0.5000 mg | ORAL_TABLET | Freq: Three times a day (TID) | ORAL | 0 refills | Status: DC | PRN

## 2018-07-14 NOTE — Telephone Encounter (Signed)
Pt needs rx on Clonazepam 0.5mg  [facility] [address] [place], [place]

## 2018-07-14 NOTE — Telephone Encounter (Signed)
Refill called in. 

## 2018-07-19 ENCOUNTER — Ambulatory Visit: Payer: BLUE CROSS/BLUE SHIELD | Admitting: Psychiatry

## 2018-07-27 ENCOUNTER — Encounter: Payer: Self-pay | Admitting: Psychiatry

## 2018-07-27 ENCOUNTER — Ambulatory Visit (INDEPENDENT_AMBULATORY_CARE_PROVIDER_SITE_OTHER): Payer: BC Managed Care – PPO | Admitting: Psychiatry

## 2018-07-27 ENCOUNTER — Other Ambulatory Visit: Payer: Self-pay

## 2018-07-27 DIAGNOSIS — F4010 Social phobia, unspecified: Secondary | ICD-10-CM

## 2018-07-27 DIAGNOSIS — F5105 Insomnia due to other mental disorder: Secondary | ICD-10-CM | POA: Diagnosis not present

## 2018-07-27 MED ORDER — PROPRANOLOL HCL 20 MG PO TABS: 20.0000 mg | ORAL_TABLET | Freq: Every day | ORAL | 3 refills | Status: DC | PRN

## 2018-07-27 MED ORDER — CLONAZEPAM 0.5 MG PO TABS: 0.5000 mg | ORAL_TABLET | Freq: Three times a day (TID) | ORAL | 3 refills | Status: DC | PRN

## 2018-07-27 NOTE — Patient Instructions (Signed)
Viibryd option

## 2018-07-27 NOTE — Progress Notes (Signed)
Blake [name] [id number] April 16, 1990 28 y.o.  Subjective:   Patient ID:  Blake [name] is a 28 y.o. (DOB 31-Dec-1990) male.  Chief Complaint:  Chief Complaint  Patient presents with  . Follow-up    Medication Management  . ADD    Medication Management  . Depression    Medication Management    HPI Blake [name] presents to the office today for follow-up of anxiety and history of depression.  Poor response to SSRIs.  Last seen April 15, 2018 by Comer Locket, PA.  His only psychiatric med was clonazepam 0.5 3 times daily as needed anxiety.  No meds were changed.  About the same and better than when first seen a couple of years ago.  Mainly anxiety but sometimes anxiety causes the depression.  Pt reports that mood is Anxious and not depressed and describes anxiety as Minimal. Anxiety symptoms include: Excessive Worry, Social Anxiety,. Last panic a couple mos ago.  Some social avoidance.  Pt reports has difficulty falling asleep and racing mind at night. Pt reports that appetite is good. Pt reports that energy is no change and good. Concentration is good. Suicidal thoughts:  denied by patient.  Social anxiety can include racing negative thoughts and sweats.    Usually takes Klonopin 0.5 mg in AM and 1.0 mg HS for sleep.  Self employed fixing cars.  Anxiety interferes with career function.  Past Psychiatric Medication Trials: Fluoxetine, Lexapro irritable, sertraline lethargy, citalopram, duloxetine, clomipramine, lithium, lamotrigine, Wellbutrin, Adderall, Trintellix worsening anxiety, buspirone, mirtazapine  Review of Systems:  Review of Systems  Neurological: Negative for tremors and weakness.   Medications: I have reviewed the patient's current medications.  Current Outpatient Medications  Medication Sig Dispense Refill  . clonazePAM (KLONOPIN) 0.5 MG tablet Take 1 tablet (0.5 mg total) by mouth every 8 (eight) hours as needed for anxiety. 90 tablet 0  . doxylamine,  Sleep, (UNISOM) 25 MG tablet Take 25 mg by mouth at bedtime as needed.     No current facility-administered medications for this visit.     Medication Side Effects: None  Allergies: No Known Allergies  Past Medical History:  Diagnosis Date  . ADD 09/21/2008  . Depression   . GERD 09/14/2008  . History of chicken pox   . HYPERHIDROSIS 09/14/2008  . Other seborrheic dermatitis 09/14/2008    Family History  Problem Relation Age of Onset  . Hyperlipidemia Mother   . Hyperlipidemia Father   . Mental illness Maternal Uncle   . Alcohol abuse Other        uncle    Social History   Socioeconomic History  . Marital status: Single    Spouse name: Not on file  . Number of children: Not on file  . Years of education: Not on file  . Highest education level: Not on file  Occupational History  . Not on file  Social Needs  . Financial resource strain: Not on file  . Food insecurity    Worry: Not on file    Inability: Not on file  . Transportation needs    Medical: Not on file    Non-medical: Not on file  Tobacco Use  . Smoking status: Former Smoker    Packs/day: 0.50    Years: 5.00    Pack years: 2.50    Types: Cigarettes  . Smokeless tobacco: Never Used  . Tobacco comment: E-Cig  Substance and Sexual Activity  . Alcohol use: Yes  . Drug use: No  .  Sexual activity: Not on file  Lifestyle  . Physical activity    Days per week: Not on file    Minutes per session: Not on file  . Stress: Not on file  Relationships  . Social Musicianconnections    Talks on phone: Not on file    Gets together: Not on file    Attends religious service: Not on file    Active member of club or organization: Not on file    Attends meetings of clubs or organizations: Not on file    Relationship status: Not on file  . Intimate partner violence    Fear of current or ex partner: Not on file    Emotionally abused: Not on file    Physically abused: Not on file    Forced sexual activity: Not on file  Other  Topics Concern  . Not on file  Social History Narrative  . Not on file    Past Medical History, Surgical history, Social history, and Family history were reviewed and updated as appropriate.   Please see review of systems for further details on the patient's review from today.   Objective:   Physical Exam:  There were no vitals taken for this visit.  Physical Exam Constitutional:      General: He is not in acute distress.    Appearance: He is well-developed.  Musculoskeletal:        General: No deformity.  Neurological:     Mental Status: He is alert and oriented to person, place, and time.     Coordination: Coordination normal.  Psychiatric:        Attention and Perception: Attention normal. He is attentive.        Mood and Affect: Mood is anxious. Mood is not depressed. Affect is not labile, blunt, angry or inappropriate.        Speech: Speech normal.        Behavior: Behavior normal.        Thought Content: Thought content normal. Thought content does not include homicidal or suicidal ideation. Thought content does not include homicidal or suicidal plan.        Cognition and Memory: Cognition normal.        Judgment: Judgment normal.     Comments: Insight is good.     Lab Review:     Component Value Date/Time   NA 143 01/14/2015 1057   K 5.0 01/14/2015 1057   CL 104 01/14/2015 1057   CO2 28 01/14/2015 1057   GLUCOSE 93 01/14/2015 1057   BUN 15 01/14/2015 1057   CREATININE 1.05 01/14/2015 1057   CALCIUM 9.8 01/14/2015 1057   PROT 6.8 01/14/2015 1057   ALBUMIN 4.6 01/14/2015 1057   AST 18 01/14/2015 1057   ALT 30 01/14/2015 1057   ALKPHOS 60 01/14/2015 1057   BILITOT 0.4 01/14/2015 1057       Component Value Date/Time   WBC 7.9 01/14/2015 1057   RBC 5.57 01/14/2015 1057   HGB 16.4 01/14/2015 1057   HCT 48.6 01/14/2015 1057   PLT 226.0 01/14/2015 1057   MCV 87.4 01/14/2015 1057   MCHC 33.7 01/14/2015 1057   RDW 12.6 01/14/2015 1057   LYMPHSABS 2.5  01/14/2015 1057   MONOABS 0.6 01/14/2015 1057   EOSABS 0.3 01/14/2015 1057   BASOSABS 0.0 01/14/2015 1057    No results found for: POCLITH, LITHIUM   No results found for: PHENYTOIN, PHENOBARB, VALPROATE, CBMZ   .res Assessment: Plan:    Blake [name]  was seen today for follow-up, add, depression and medication refill.  Diagnoses and all orders for this visit:  Social anxiety disorder -     propranolol (INDERAL) 20 MG tablet; Take 1-2 tablets (20-40 mg total) by mouth daily as needed. -     clonazePAM (KLONOPIN) 0.5 MG tablet; Take 1 tablet (0.5 mg total) by mouth every 8 (eight) hours as needed for anxiety.  Insomnia due to mental condition -     clonazePAM (KLONOPIN) 0.5 MG tablet; Take 1 tablet (0.5 mg total) by mouth every 8 (eight) hours as needed for anxiety.   Greater than 50% of face to face time with patient was spent on counseling and coordination of care. We discussed following. Reviewed his multiple med failures with him.  He has not responded well to SSRIs in the past.  Answered questions about Genesight testing and other genetic testing in detail.  At this point it is not likely to change treatment recommendations..  Probably not in his case.Viibryd option as this is not a traditional SSRI and is not as dependent on the serotonin transporter as other SSRIs may have a greater potential to work.  Discussed side effects.  He prefers not to pursue this at this time but will consider it.  Propranolol was discussed in detail for situational social anxiety such as job interviews.  Discussed beta-blockers and their mechanism of action in detail as well as their side effects.  He is open to trying propranolol 20 to 40 mg as needed social interactions.  He feels that the clonazepam is effective and wants to continue that.  Saw helpful also for his sleep.  Okay to continue clonazepam 0.5 mg every morning and 1 mg nightly. We discussed the short-term risks associated with benzodiazepines  including sedation and increased fall risk among others.  Discussed long-term side effect risk including dependence, potential withdrawal symptoms, and the potential eventual dose-related risk of dementia.  His anxiety symptoms are not well controlled and interfere with his ability to function as well occupationally as he has the potential to function.  Discussed the option of further counseling to help with this as well..  This is a 30-minute appointment  Follow-up 2 to 3 months  Maurice Marcharrie Cottle, MD, DFAPA  Please see After Visit Summary for patient specific instructions.  No future appointments.  No orders of the defined types were placed in this encounter.   -------------------------------

## 2018-10-20 ENCOUNTER — Other Ambulatory Visit: Payer: Self-pay | Admitting: Psychiatry

## 2018-10-20 DIAGNOSIS — F4010 Social phobia, unspecified: Secondary | ICD-10-CM

## 2018-11-24 ENCOUNTER — Ambulatory Visit (INDEPENDENT_AMBULATORY_CARE_PROVIDER_SITE_OTHER): Payer: BC Managed Care – PPO | Admitting: Psychiatry

## 2018-11-24 ENCOUNTER — Encounter: Payer: Self-pay | Admitting: Psychiatry

## 2018-11-24 ENCOUNTER — Other Ambulatory Visit: Payer: Self-pay

## 2018-11-24 DIAGNOSIS — F5105 Insomnia due to other mental disorder: Secondary | ICD-10-CM

## 2018-11-24 DIAGNOSIS — F410 Panic disorder [episodic paroxysmal anxiety] without agoraphobia: Secondary | ICD-10-CM | POA: Diagnosis not present

## 2018-11-24 DIAGNOSIS — F4010 Social phobia, unspecified: Secondary | ICD-10-CM

## 2018-11-24 MED ORDER — PROPRANOLOL HCL 20 MG PO TABS: 20.0000 mg | ORAL_TABLET | Freq: Every day | ORAL | 0 refills | Status: DC | PRN

## 2018-11-24 MED ORDER — CLONAZEPAM 0.5 MG PO TABS: 0.5000 mg | ORAL_TABLET | Freq: Three times a day (TID) | ORAL | 5 refills | Status: DC | PRN

## 2018-11-24 NOTE — Progress Notes (Signed)
Blake Ryderustin H Jarnagin [id number] 1990/04/01 28 y.o.  Subjective:   Patient ID:  Blake [name] is a 28 y.o. (DOB 1990/04/01) male.  Chief Complaint:  Chief Complaint  Patient presents with  . Follow-up    Medication Management  . Anxiety    Medication Management    HPI Blake Ryderustin H Aune presents to the office today for follow-up of anxiety and history of depression.  Poor response to SSRIs.  Last seen April 15, 2018 by Anne Fulay Shugart, PA.  His only psychiatric med was clonazepam 0.5 3 times daily as needed anxiety.  No meds were changed.  Last seen June without med changes.  Disc Viibryd for residual anxiety but didn't start.  About the same Re anxiety. and better than when first seen a couple of years ago.  Mainly anxiety but sometimes anxiety causes the depression.  Pt reports that mood is Anxious and not depressed and describes anxiety as Minimal. Anxiety symptoms include: Excessive Worry, Social Anxiety,. Last panic a couple mos ago.  Some social avoidance.  Pt reports has difficulty falling asleep and racing mind at night chronically. Average 9 hours.  Pt reports that appetite is good. Pt reports that energy is no change and good. Concentration is good. Suicidal thoughts:  denied by patient.  No panic lately.  Social anxiety can include racing negative thoughts and sweats.    Usually takes Klonopin 0.5 mg in AM and 1.0 mg HS for sleep. Propranolol prn causes a little drowsiness but does help.  Self employed fixing cars.  Anxiety interferes with career function.  Past Psychiatric Medication Trials: Fluoxetine, Lexapro irritable, sertraline lethargy, citalopram, duloxetine, clomipramine, lithium, lamotrigine, Wellbutrin, Adderall, Trintellix worsening anxiety, buspirone, mirtazapine  Review of Systems:  Review of Systems  Neurological: Negative for tremors and weakness.  Psychiatric/Behavioral: Positive for depression.   Medications: I have reviewed the patient's current  medications.  Current Outpatient Medications  Medication Sig Dispense Refill  . clonazePAM (KLONOPIN) 0.5 MG tablet Take 1 tablet (0.5 mg total) by mouth every 8 (eight) hours as needed for anxiety. 90 tablet 3  . doxylamine, Sleep, (UNISOM) 25 MG tablet Take 25 mg by mouth at bedtime as needed.    . propranolol (INDERAL) 20 MG tablet TAKE 1-2 TABLETS (20-40 MG TOTAL) BY MOUTH DAILY AS NEEDED. 180 tablet 1   No current facility-administered medications for this visit.     Medication Side Effects: None  Allergies: No Known Allergies  Past Medical History:  Diagnosis Date  . ADD 09/21/2008  . Depression   . GERD 09/14/2008  . History of chicken pox   . HYPERHIDROSIS 09/14/2008  . Other seborrheic dermatitis 09/14/2008    Family History  Problem Relation Age of Onset  . Hyperlipidemia Mother   . Hyperlipidemia Father   . Mental illness Maternal Uncle   . Alcohol abuse Other        uncle    Social History   Socioeconomic History  . Marital status: Single    Spouse name: Not on file  . Number of children: Not on file  . Years of education: Not on file  . Highest education level: Not on file  Occupational History  . Not on file  Social Needs  . Financial resource strain: Not on file  . Food insecurity    Worry: Not on file    Inability: Not on file  . Transportation needs    Medical: Not on file    Non-medical: Not on file  Tobacco Use  .  Smoking status: Former Smoker    Packs/day: 0.50    Years: 5.00    Pack years: 2.50    Types: Cigarettes  . Smokeless tobacco: Never Used  . Tobacco comment: E-Cig  Substance and Sexual Activity  . Alcohol use: Yes  . Drug use: No  . Sexual activity: Not on file  Lifestyle  . Physical activity    Days per week: Not on file    Minutes per session: Not on file  . Stress: Not on file  Relationships  . Social Herbalist on phone: Not on file    Gets together: Not on file    Attends religious service: Not on file     Active member of club or organization: Not on file    Attends meetings of clubs or organizations: Not on file    Relationship status: Not on file  . Intimate partner violence    Fear of current or ex partner: Not on file    Emotionally abused: Not on file    Physically abused: Not on file    Forced sexual activity: Not on file  Other Topics Concern  . Not on file  Social History Narrative  . Not on file    Past Medical History, Surgical history, Social history, and Family history were reviewed and updated as appropriate.   Please see review of systems for further details on the patient's review from today.   Objective:   Physical Exam:  There were no vitals taken for this visit.  Physical Exam Constitutional:      General: He is not in acute distress.    Appearance: He is well-developed.  Musculoskeletal:        General: No deformity.  Neurological:     Mental Status: He is alert and oriented to person, place, and time.     Coordination: Coordination normal.  Psychiatric:        Attention and Perception: Attention normal. He is attentive.        Mood and Affect: Affect normal. Mood is anxious. Mood is not depressed. Affect is not labile, blunt, angry or inappropriate.        Speech: Speech normal.        Behavior: Behavior normal.        Thought Content: Thought content normal. Thought content does not include homicidal or suicidal ideation. Thought content does not include homicidal or suicidal plan.        Cognition and Memory: Cognition normal.        Judgment: Judgment normal.     Comments: Insight is good.     Lab Review:     Component Value Date/Time   NA 143 01/14/2015 1057   K 5.0 01/14/2015 1057   CL 104 01/14/2015 1057   CO2 28 01/14/2015 1057   GLUCOSE 93 01/14/2015 1057   BUN 15 01/14/2015 1057   CREATININE 1.05 01/14/2015 1057   CALCIUM 9.8 01/14/2015 1057   PROT 6.8 01/14/2015 1057   ALBUMIN 4.6 01/14/2015 1057   AST 18 01/14/2015 1057   ALT 30  01/14/2015 1057   ALKPHOS 60 01/14/2015 1057   BILITOT 0.4 01/14/2015 1057       Component Value Date/Time   WBC 7.9 01/14/2015 1057   RBC 5.57 01/14/2015 1057   HGB 16.4 01/14/2015 1057   HCT 48.6 01/14/2015 1057   PLT 226.0 01/14/2015 1057   MCV 87.4 01/14/2015 1057   MCHC 33.7 01/14/2015 1057   RDW  12.6 01/14/2015 1057   LYMPHSABS 2.5 01/14/2015 1057   MONOABS 0.6 01/14/2015 1057   EOSABS 0.3 01/14/2015 1057   BASOSABS 0.0 01/14/2015 1057    No results found for: POCLITH, LITHIUM   No results found for: PHENYTOIN, PHENOBARB, VALPROATE, CBMZ   .res Assessment: Plan:    Blake [name] was seen today for follow-up and anxiety.  Diagnoses and all orders for this visit:  Social anxiety disorder  Insomnia due to mental condition  Panic disorder   Greater than 50% of face to face time with patient was spent on counseling and coordination of care. We discussed following. Reviewed his multiple med failures with him.  He has not responded well to SSRIs in the past.  Answered questions about Genesight testing and other genetic testing in detail.  At this point it is not likely to change treatment recommendations..  Probably not in his case.Viibryd option as this is not a traditional SSRI and is not as dependent on the serotonin transporter as other SSRIs may have a greater potential to work.  Discussed side effects.  He prefers not to pursue this at this time but will consider it.  Propranolol was discussed in detail for situational social anxiety such as job interviews.  Discussed beta-blockers and their mechanism of action in detail as well as their side effects.  He is open to trying propranolol 20 to 40 mg as needed social interactions.  He feels that the clonazepam is effective and wants to continue that.  Saw helpful also for his sleep.  Okay to continue clonazepam 0.5 mg every morning and 1 mg nightly. He wants to eventually taper clonazepam.  We discussed the short-term risks  associated with benzodiazepines including sedation and increased fall risk among others.  Discussed long-term side effect risk including dependence, potential withdrawal symptoms, and the potential eventual dose-related risk of dementia.  His anxiety symptoms are not well controlled and interfere with his ability to function as well occupationally as he has the potential to function.  Discussed the option of further counseling to help with this as well..  This is a 30-minute appointment  Follow-up 4-6 months  Maurice March, MD, DFAPA  Please see After Visit Summary for patient specific instructions.  No future appointments.  No orders of the defined types were placed in this encounter.   -------------------------------

## 2019-05-03 ENCOUNTER — Other Ambulatory Visit: Payer: Self-pay | Admitting: Psychiatry

## 2019-05-03 DIAGNOSIS — F5105 Insomnia due to other mental disorder: Secondary | ICD-10-CM

## 2019-05-03 DIAGNOSIS — F4010 Social phobia, unspecified: Secondary | ICD-10-CM

## 2019-05-04 ENCOUNTER — Other Ambulatory Visit: Payer: Self-pay

## 2019-05-04 NOTE — Telephone Encounter (Signed)
Next apt 05/25/2019

## 2019-05-25 ENCOUNTER — Other Ambulatory Visit: Payer: Self-pay

## 2019-05-25 ENCOUNTER — Ambulatory Visit (INDEPENDENT_AMBULATORY_CARE_PROVIDER_SITE_OTHER): Payer: BC Managed Care – PPO | Admitting: Psychiatry

## 2019-05-25 ENCOUNTER — Encounter: Payer: Self-pay | Admitting: Psychiatry

## 2019-05-25 DIAGNOSIS — F5105 Insomnia due to other mental disorder: Secondary | ICD-10-CM

## 2019-05-25 DIAGNOSIS — F329 Major depressive disorder, single episode, unspecified: Secondary | ICD-10-CM

## 2019-05-25 DIAGNOSIS — F410 Panic disorder [episodic paroxysmal anxiety] without agoraphobia: Secondary | ICD-10-CM

## 2019-05-25 DIAGNOSIS — F4010 Social phobia, unspecified: Secondary | ICD-10-CM

## 2019-05-25 MED ORDER — CLONAZEPAM 0.5 MG PO TABS: 0.5000 mg | ORAL_TABLET | Freq: Three times a day (TID) | ORAL | 5 refills | Status: DC | PRN

## 2019-05-25 NOTE — Progress Notes (Signed)
Blake [name] [id number] [birth year] 28 y.o.  Subjective:   Patient ID:  Blake [name] is a 29 y.o. (DOB [birth year]) male.  Chief Complaint:  Chief Complaint  Patient presents with  . Follow-up    Psychiatric diagnosis and med management  . Anxiety    Blake [name] presents to the office today for follow-up of anxiety and history of depression.  Poor response to SSRIs.  seen April 15, 2018 by Anne Fu, PA.  His only psychiatric med was clonazepam 0.5 3 times daily as needed anxiety.  No meds were changed.  seen June without med changes.  Disc Viibryd for residual anxiety but didn't start.  Last seen October 2020 without med changes.  He continued clonazepam, propranolol as needed social anxiety and performance anxiety, doxylamine for sleep.  Overall alright.  Trying to make the best of the stress in the world. About the same Re anxiety. and better than when first seen a couple of years ago.  No panic attacks. Mainly anxiety but sometimes anxiety causes the depression.  Pt reports that mood is Anxious and not depressed and describes anxiety as Minimal. Anxiety symptoms include: Excessive Worry, Social Anxiety,. Last panic a couple mos ago.  Some social avoidance.  Pt reports has difficulty falling asleep and racing mind at night chronically. Average 9 hours.  Pt reports that appetite is good. Pt reports that energy is no change and good. Concentration is good. Suicidal thoughts:  denied by patient.  No panic lately.  Social anxiety can include racing negative thoughts and sweats.    Consistently takes Klonopin 0.5 mg in AM and 1.0 mg HS for sleep. Otherwise gets anxious racing thoughts at night. Propranolol prn causes a little drowsiness but does help.  Self employed fixing cars.  Anxiety interferes with career function.  Past Psychiatric Medication Trials: Fluoxetine, Lexapro irritable, sertraline lethargy, citalopram, duloxetine, clomipramine, lithium, lamotrigine,  Wellbutrin, Adderall, Trintellix worsening anxiety, buspirone, mirtazapine  Review of Systems:  Review of Systems  Neurological: Negative for dizziness, tremors and weakness.  Psychiatric/Behavioral: Positive for depression.   Medications: I have reviewed the patient's current medications.  Current Outpatient Medications  Medication Sig Dispense Refill  . clonazePAM (KLONOPIN) 0.5 MG tablet Take 1 tablet (0.5 mg total) by mouth every 8 (eight) hours as needed. for anxiety 90 tablet 5  . doxylamine, Sleep, (UNISOM) 25 MG tablet Take 25 mg by mouth at bedtime as needed.    . propranolol (INDERAL) 20 MG tablet Take 1-2 tablets (20-40 mg total) by mouth daily as needed. 180 tablet 0   No current facility-administered medications for this visit.    Medication Side Effects: None  Allergies: No Known Allergies  Past Medical History:  Diagnosis Date  . ADD 09/21/2008  . Depression   . GERD 09/14/2008  . History of chicken pox   . HYPERHIDROSIS 09/14/2008  . Other seborrheic dermatitis 09/14/2008    Family History  Problem Relation Age of Onset  . Hyperlipidemia Mother   . Hyperlipidemia Father   . Mental illness Maternal Uncle   . Alcohol abuse Other        uncle    Social History   Socioeconomic History  . Marital status: Single    Spouse name: Not on file  . Number of children: Not on file  . Years of education: Not on file  . Highest education level: Not on file  Occupational History  . Not on file  Tobacco Use  . Smoking status: Former  Smoker    Packs/day: 0.50    Years: 5.00    Pack years: 2.50    Types: Cigarettes  . Smokeless tobacco: Never Used  . Tobacco comment: E-Cig  Substance and Sexual Activity  . Alcohol use: Yes  . Drug use: No  . Sexual activity: Not on file  Other Topics Concern  . Not on file  Social History Narrative  . Not on file   Social Determinants of Health   Financial Resource Strain:   . Difficulty of Paying Living Expenses:   Food  Insecurity:   . Worried About Charity fundraiser in the Last Year:   . Arboriculturist in the Last Year:   Transportation Needs:   . Film/video editor (Medical):   Marland Kitchen Lack of Transportation (Non-Medical):   Physical Activity:   . Days of Exercise per Week:   . Minutes of Exercise per Session:   Stress:   . Feeling of Stress :   Social Connections:   . Frequency of Communication with Friends and Family:   . Frequency of Social Gatherings with Friends and Family:   . Attends Religious Services:   . Active Member of Clubs or Organizations:   . Attends Archivist Meetings:   Marland Kitchen Marital Status:   Intimate Partner Violence:   . Fear of Current or Ex-Partner:   . Emotionally Abused:   Marland Kitchen Physically Abused:   . Sexually Abused:     Past Medical History, Surgical history, Social history, and Family history were reviewed and updated as appropriate.   Please see review of systems for further details on the patient's review from today.   Objective:   Physical Exam:  There were no vitals taken for this visit.  Physical Exam Constitutional:      General: He is not in acute distress.    Appearance: He is well-developed.  Musculoskeletal:        General: No deformity.  Neurological:     Mental Status: He is alert and oriented to person, place, and time.     Coordination: Coordination normal.  Psychiatric:        Attention and Perception: Attention normal. He is attentive.        Mood and Affect: Affect normal. Mood is anxious. Mood is not depressed. Affect is not labile, blunt, angry or inappropriate.        Speech: Speech normal. Speech is not slurred.        Behavior: Behavior normal.        Thought Content: Thought content normal. Thought content does not include homicidal or suicidal ideation. Thought content does not include homicidal or suicidal plan.        Cognition and Memory: Cognition normal.        Judgment: Judgment normal.     Comments: Insight is good.      Lab Review:     Component Value Date/Time   NA 143 01/14/2015 1057   K 5.0 01/14/2015 1057   CL 104 01/14/2015 1057   CO2 28 01/14/2015 1057   GLUCOSE 93 01/14/2015 1057   BUN 15 01/14/2015 1057   CREATININE 1.05 01/14/2015 1057   CALCIUM 9.8 01/14/2015 1057   PROT 6.8 01/14/2015 1057   ALBUMIN 4.6 01/14/2015 1057   AST 18 01/14/2015 1057   ALT 30 01/14/2015 1057   ALKPHOS 60 01/14/2015 1057   BILITOT 0.4 01/14/2015 1057       Component Value Date/Time   WBC  7.9 01/14/2015 1057   RBC 5.57 01/14/2015 1057   HGB 16.4 01/14/2015 1057   HCT 48.6 01/14/2015 1057   PLT 226.0 01/14/2015 1057   MCV 87.4 01/14/2015 1057   MCHC 33.7 01/14/2015 1057   RDW 12.6 01/14/2015 1057   LYMPHSABS 2.5 01/14/2015 1057   MONOABS 0.6 01/14/2015 1057   EOSABS 0.3 01/14/2015 1057   BASOSABS 0.0 01/14/2015 1057    No results found for: POCLITH, LITHIUM   No results found for: PHENYTOIN, PHENOBARB, VALPROATE, CBMZ   .res Assessment: Plan:    Jeston was seen today for follow-up and anxiety.  Diagnoses and all orders for this visit:  Social anxiety disorder -     clonazePAM (KLONOPIN) 0.5 MG tablet; Take 1 tablet (0.5 mg total) by mouth every 8 (eight) hours as needed. for anxiety  Insomnia due to mental condition -     clonazePAM (KLONOPIN) 0.5 MG tablet; Take 1 tablet (0.5 mg total) by mouth every 8 (eight) hours as needed. for anxiety  Panic disorder  Major depressive disorder with current active episode, unspecified depression episode severity, unspecified whether recurrent   Greater than 50% of face to face time with patient was spent on counseling and coordination of care. We discussed following. Reviewed his multiple med failures with him.  He has not responded well to SSRIs in the past.  Answered questions about Genesight testing and other genetic testing in detail.  At this point it is not likely to change treatment recommendations..  Probably not in his case.Viibryd  option as this is not a traditional SSRI and is not as dependent on the serotonin transporter as other SSRIs may have a greater potential to work.  Discussed side effects.  He prefers not to pursue this at this time but will consider it.  Propranolol was discussed in detail for situational social anxiety such as job interviews.  Discussed beta-blockers and their mechanism of action in detail as well as their side effects.  He is open to trying propranolol 20 to 40 mg as needed social interactions.  He feels that the clonazepam is effective and wants to continue that.  Saw helpful also for his sleep.  Okay to continue clonazepam 0.5 mg every morning and 1 mg nightly. We discussed the short-term risks associated with benzodiazepines including sedation and increased fall risk among others.  Discussed long-term side effect risk including dependence, potential withdrawal symptoms, and the potential eventual dose-related risk of dementia.  But recent studies from 2020 dispute this association between benzodiazepines and dementia risk. Newer studies in 2020 do not support an association with dementia. Disc option slow taper.  His anxiety symptoms are not well controlled and interfere with his ability to function as well occupationally as he has the potential to function.  Discussed the option of further counseling to help with this as well..  Follow-up 6 months  Maurice March, MD, DFAPA  Please see After Visit Summary for patient specific instructions.  No future appointments.  No orders of the defined types were placed in this encounter.   -------------------------------

## 2019-11-16 ENCOUNTER — Other Ambulatory Visit: Payer: Self-pay | Admitting: Psychiatry

## 2019-11-16 DIAGNOSIS — F5105 Insomnia due to other mental disorder: Secondary | ICD-10-CM

## 2019-11-16 DIAGNOSIS — F4010 Social phobia, unspecified: Secondary | ICD-10-CM

## 2019-11-23 ENCOUNTER — Encounter: Payer: Self-pay | Admitting: Psychiatry

## 2019-11-23 ENCOUNTER — Other Ambulatory Visit: Payer: Self-pay

## 2019-11-23 ENCOUNTER — Ambulatory Visit (INDEPENDENT_AMBULATORY_CARE_PROVIDER_SITE_OTHER): Payer: Self-pay | Admitting: Psychiatry

## 2019-11-23 DIAGNOSIS — F410 Panic disorder [episodic paroxysmal anxiety] without agoraphobia: Secondary | ICD-10-CM

## 2019-11-23 DIAGNOSIS — F5105 Insomnia due to other mental disorder: Secondary | ICD-10-CM

## 2019-11-23 DIAGNOSIS — F4010 Social phobia, unspecified: Secondary | ICD-10-CM

## 2019-11-23 MED ORDER — CLONAZEPAM 0.5 MG PO TABS: 0.5000 mg | ORAL_TABLET | Freq: Three times a day (TID) | ORAL | 5 refills | Status: DC | PRN

## 2019-11-23 MED ORDER — PROPRANOLOL HCL 20 MG PO TABS: 20.0000 mg | ORAL_TABLET | Freq: Every day | ORAL | 1 refills | Status: DC | PRN

## 2019-11-23 NOTE — Progress Notes (Signed)
Blake [name] [id number] 11/11/1990 29 y.o.  Subjective:   Patient ID:  Blake Ryderustin H Buth is a 29 y.o. (DOB 11/11/1990) male.  Chief Complaint:  Chief Complaint  Patient presents with  . Follow-up  . Anxiety  . Sleeping Problem    Blake Ryderustin H Gimpel presents to the office today for follow-up of anxiety and history of depression.  Poor response to SSRIs.  seen April 15, 2018 by Anne Fulay Shugart, PA.  His only psychiatric med was clonazepam 0.5 3 times daily as needed anxiety.  No meds were changed.  seen June without med changes.  Disc Viibryd for residual anxiety but didn't start.  seen October 2020 without med changes.  He continued clonazepam, propranolol as needed social anxiety and performance anxiety, doxylamine for sleep.  4/21 appt with following noted and no med changes. Overall alright.  Trying to make the best of the stress in the world. About the same Re anxiety. and better than when first seen a couple of years ago.  No panic attacks. Mainly anxiety but sometimes anxiety causes the depression.  11/23/2019 appointment with the following noted: Consistently takes Klonopin 0.5 mg in AM and 1.0 mg HS for sleep. Otherwise gets anxious racing thoughts at night.  Infrequent propranolol. Some initial insomnia.  Caffeine up to 8 PM. No new concerns.  No SE.  Pt reports that mood is Anxious and not depressed and describes anxiety as about the samel. Anxiety symptoms include: Excessive Worry, Social Anxiety,. Last panic a couple mos ago.  Some social avoidance.  Pt reports has difficulty falling asleep and racing mind at night chronically. Average 9 hours.  Pt reports that appetite is good. Pt reports that energy is no change and good. Concentration is good. Suicidal thoughts:  denied by patient.  No panic lately.  Social anxiety can include racing negative thoughts and sweats.    Consistently takes Klonopin 0.5 mg in AM and 1.0 mg HS for sleep. Otherwise gets anxious racing thoughts  at night.  Propranolol prn causes a little drowsiness but does help.  Self employed fixing cars.  Anxiety interferes with career function but satisfied..  Past Psychiatric Medication Trials: Fluoxetine, Lexapro irritable, sertraline lethargy, citalopram, duloxetine, clomipramine, lithium, lamotrigine, Wellbutrin, Adderall, Trintellix worsening anxiety, buspirone, mirtazapine  Review of Systems:  Review of Systems  Cardiovascular: Negative for palpitations.  Neurological: Negative for dizziness, tremors and weakness.   Medications: I have reviewed the patient's current medications.  Current Outpatient Medications  Medication Sig Dispense Refill  . clonazePAM (KLONOPIN) 0.5 MG tablet Take 1 tablet (0.5 mg total) by mouth every 8 (eight) hours as needed. for anxiety 90 tablet 5  . doxylamine, Sleep, (UNISOM) 25 MG tablet Take 25 mg by mouth at bedtime as needed.    . propranolol (INDERAL) 20 MG tablet Take 1-2 tablets (20-40 mg total) by mouth daily as needed. 90 tablet 1   No current facility-administered medications for this visit.    Medication Side Effects: None  Allergies: No Known Allergies  Past Medical History:  Diagnosis Date  . ADD 09/21/2008  . Depression   . GERD 09/14/2008  . History of chicken pox   . HYPERHIDROSIS 09/14/2008  . Other seborrheic dermatitis 09/14/2008    Family History  Problem Relation Age of Onset  . Hyperlipidemia Mother   . Hyperlipidemia Father   . Mental illness Maternal Uncle   . Alcohol abuse Other        uncle    Social History   Socioeconomic  History  . Marital status: Single    Spouse name: Not on file  . Number of children: Not on file  . Years of education: Not on file  . Highest education level: Not on file  Occupational History  . Not on file  Tobacco Use  . Smoking status: Former Smoker    Packs/day: 0.50    Years: 5.00    Pack years: 2.50    Types: Cigarettes  . Smokeless tobacco: Never Used  . Tobacco comment: E-Cig   Substance and Sexual Activity  . Alcohol use: Yes  . Drug use: No  . Sexual activity: Not on file  Other Topics Concern  . Not on file  Social History Narrative  . Not on file   Social Determinants of Health   Financial Resource Strain:   . Difficulty of Paying Living Expenses: Not on file  Food Insecurity:   . Worried About Programme researcher, broadcasting/film/video in the Last Year: Not on file  . Ran Out of Food in the Last Year: Not on file  Transportation Needs:   . Lack of Transportation (Medical): Not on file  . Lack of Transportation (Non-Medical): Not on file  Physical Activity:   . Days of Exercise per Week: Not on file  . Minutes of Exercise per Session: Not on file  Stress:   . Feeling of Stress : Not on file  Social Connections:   . Frequency of Communication with Friends and Family: Not on file  . Frequency of Social Gatherings with Friends and Family: Not on file  . Attends Religious Services: Not on file  . Active Member of Clubs or Organizations: Not on file  . Attends Banker Meetings: Not on file  . Marital Status: Not on file  Intimate Partner Violence:   . Fear of Current or Ex-Partner: Not on file  . Emotionally Abused: Not on file  . Physically Abused: Not on file  . Sexually Abused: Not on file    Past Medical History, Surgical history, Social history, and Family history were reviewed and updated as appropriate.   Please see review of systems for further details on the patient's review from today.   Objective:   Physical Exam:  There were no vitals taken for this visit.  Physical Exam Constitutional:      General: He is not in acute distress.    Appearance: He is well-developed.  Musculoskeletal:        General: No deformity.  Neurological:     Mental Status: He is alert and oriented to person, place, and time.     Coordination: Coordination normal.  Psychiatric:        Attention and Perception: Attention normal. He is attentive.        Mood  and Affect: Affect normal. Mood is anxious. Mood is not depressed. Affect is not labile, blunt, angry or inappropriate.        Speech: Speech normal. Speech is not slurred.        Behavior: Behavior normal.        Thought Content: Thought content normal. Thought content does not include homicidal or suicidal ideation. Thought content does not include homicidal or suicidal plan.        Cognition and Memory: Cognition normal.        Judgment: Judgment normal.     Comments: Insight is good.     Lab Review:     Component Value Date/Time   NA 143 01/14/2015 1057  K 5.0 01/14/2015 1057   CL 104 01/14/2015 1057   CO2 28 01/14/2015 1057   GLUCOSE 93 01/14/2015 1057   BUN 15 01/14/2015 1057   CREATININE 1.05 01/14/2015 1057   CALCIUM 9.8 01/14/2015 1057   PROT 6.8 01/14/2015 1057   ALBUMIN 4.6 01/14/2015 1057   AST 18 01/14/2015 1057   ALT 30 01/14/2015 1057   ALKPHOS 60 01/14/2015 1057   BILITOT 0.4 01/14/2015 1057       Component Value Date/Time   WBC 7.9 01/14/2015 1057   RBC 5.57 01/14/2015 1057   HGB 16.4 01/14/2015 1057   HCT 48.6 01/14/2015 1057   PLT 226.0 01/14/2015 1057   MCV 87.4 01/14/2015 1057   MCHC 33.7 01/14/2015 1057   RDW 12.6 01/14/2015 1057   LYMPHSABS 2.5 01/14/2015 1057   MONOABS 0.6 01/14/2015 1057   EOSABS 0.3 01/14/2015 1057   BASOSABS 0.0 01/14/2015 1057    No results found for: POCLITH, LITHIUM   No results found for: PHENYTOIN, PHENOBARB, VALPROATE, CBMZ   .res Assessment: Plan:    Terell was seen today for follow-up, anxiety and sleeping problem.  Diagnoses and all orders for this visit:  Social anxiety disorder -     clonazePAM (KLONOPIN) 0.5 MG tablet; Take 1 tablet (0.5 mg total) by mouth every 8 (eight) hours as needed. for anxiety -     propranolol (INDERAL) 20 MG tablet; Take 1-2 tablets (20-40 mg total) by mouth daily as needed.  Insomnia due to mental condition -     clonazePAM (KLONOPIN) 0.5 MG tablet; Take 1 tablet (0.5 mg  total) by mouth every 8 (eight) hours as needed. for anxiety  Panic disorder   Greater than 50% of face to face time with patient was spent on counseling and coordination of care. We discussed following. Reviewed his multiple med failures with him.  He has not responded well to SSRIs in the past.  Answered questions about Genesight testing and other genetic testing in detail.  At this point it is not likely to change treatment recommendations..  Probably not in his case.Viibryd option as this is not a traditional SSRI and is not as dependent on the serotonin transporter as other SSRIs may have a greater potential to work.  Discussed side effects.  He prefers not to pursue this at this time but will consider it.  Propranolol was discussed in detail for situational social anxiety such as job interviews.  Discussed beta-blockers and their mechanism of action in detail as well as their side effects.  He is open to trying propranolol 20 mg as needed social interactions.  Explained this again.  He's sensitive so 10-20 mg is enough.  He feels that the clonazepam is effective and wants to continue that.  Saw helpful also for his sleep.  Okay to continue clonazepam 0.5 mg every morning and 1 mg nightly. We discussed the short-term risks associated with benzodiazepines including sedation and increased fall risk among others.  Discussed long-term side effect risk including dependence, potential withdrawal symptoms, and the potential eventual dose-related risk of dementia.  But recent studies from 2020 dispute this association between benzodiazepines and dementia risk. Newer studies in 2020 do not support an association with dementia. Disc option slow taper.  Switch to decaf after 3 PM.  His anxiety symptoms are not well controlled and interfere with his ability to function as well occupationally as he has the potential to function.  Discussed the option of further counseling to help with this  as  well..  Follow-up 6 months  Maurice March, MD, DFAPA  Please see After Visit Summary for patient specific instructions.  No future appointments.  No orders of the defined types were placed in this encounter.   -------------------------------

## 2020-02-25 ENCOUNTER — Other Ambulatory Visit: Payer: Self-pay | Admitting: Psychiatry

## 2020-02-25 DIAGNOSIS — F4010 Social phobia, unspecified: Secondary | ICD-10-CM

## 2020-05-23 ENCOUNTER — Ambulatory Visit (INDEPENDENT_AMBULATORY_CARE_PROVIDER_SITE_OTHER): Payer: Self-pay | Admitting: Psychiatry

## 2020-05-23 ENCOUNTER — Other Ambulatory Visit: Payer: Self-pay

## 2020-05-23 ENCOUNTER — Encounter: Payer: Self-pay | Admitting: Psychiatry

## 2020-05-23 DIAGNOSIS — F5105 Insomnia due to other mental disorder: Secondary | ICD-10-CM

## 2020-05-23 DIAGNOSIS — F329 Major depressive disorder, single episode, unspecified: Secondary | ICD-10-CM

## 2020-05-23 DIAGNOSIS — F4010 Social phobia, unspecified: Secondary | ICD-10-CM

## 2020-05-23 DIAGNOSIS — F410 Panic disorder [episodic paroxysmal anxiety] without agoraphobia: Secondary | ICD-10-CM

## 2020-05-23 MED ORDER — CLONAZEPAM 0.5 MG PO TABS: 0.5000 mg | ORAL_TABLET | Freq: Three times a day (TID) | ORAL | 5 refills | Status: DC | PRN

## 2020-05-23 NOTE — Progress Notes (Signed)
Blake [name] [id number] Dec 15, 1990 30 y.o.  Subjective:   Patient ID:  Blake [name] is a 30 y.o. (DOB 1990-10-04) male.  Chief Complaint:  Chief Complaint  Patient presents with  . Follow-up  . Anxiety    Blake [name] presents to the office today for follow-up of anxiety and history of depression.  Poor response to SSRIs.  seen April 15, 2018 by Anne Fu, PA.  His only psychiatric med was clonazepam 0.5 3 times daily as needed anxiety.  No meds were changed.  seen June without med changes.  Disc Viibryd for residual anxiety but didn't start.  seen October 2020 without med changes.  He continued clonazepam, propranolol as needed social anxiety and performance anxiety, doxylamine for sleep.  4/21 appt with following noted and no med changes. Overall alright.  Trying to make the best of the stress in the world. About the same Re anxiety. and better than when first seen a couple of years ago.  No panic attacks. Mainly anxiety but sometimes anxiety causes the depression.  11/23/2019 appointment with the following noted: Consistently takes Klonopin 0.5 mg in AM and 1.0 mg HS for sleep. Otherwise gets anxious racing thoughts at night.  Infrequent propranolol. Some initial insomnia.  Caffeine up to 8 PM. No new concerns.  No SE. Social anxiety can include racing negative thoughts and sweats.   Consistently takes Klonopin 0.5 mg in AM and 1.0 mg HS for sleep. Otherwise gets anxious racing thoughts at night.  Propranolol prn causes a little drowsiness but does help.  Plan: no med changes  05/23/2020 appointment with the following noted:   Still finds clonazepam helpful.  More anxiety when starting new job 2 mos ago but leveled out to normal now. Have friend who works there and that helps.  Rarely uses propranolol bc not needed. Sleep good with clonazepam.  Physical labor with work helps him sleep better.   No SE. No concerns with meds and no changes desired. No drug  abuse.  Pt reports that mood is Anxious and not depressed and describes anxiety as about the samel. Anxiety symptoms include: Excessive Worry, Social Anxiety,. Last panic a couple mos ago.  Some social avoidance.   Average 9 hours.  Pt reports that appetite is good. Pt reports that energy is no change and good. Concentration is good. Suicidal thoughts:  denied by patient.  No panic lately.  Past Psychiatric Medication Trials: Fluoxetine, Lexapro irritable, sertraline lethargy, citalopram, duloxetine, clomipramine,  propranolol lithium, lamotrigine, Wellbutrin, Adderall, Trintellix worsening anxiety, buspirone, mirtazapine  Review of Systems:  Review of Systems  Cardiovascular: Negative for chest pain and palpitations.  Neurological: Negative for dizziness, tremors and weakness.   Medications: I have reviewed the patient's current medications.  Current Outpatient Medications  Medication Sig Dispense Refill  . doxylamine, Sleep, (UNISOM) 25 MG tablet Take 25 mg by mouth at bedtime as needed.    . clonazePAM (KLONOPIN) 0.5 MG tablet Take 1 tablet (0.5 mg total) by mouth every 8 (eight) hours as needed. for anxiety 90 tablet 5   No current facility-administered medications for this visit.    Medication Side Effects: None  Allergies: No Known Allergies  Past Medical History:  Diagnosis Date  . ADD 09/21/2008  . Depression   . GERD 09/14/2008  . History of chicken pox   . HYPERHIDROSIS 09/14/2008  . Other seborrheic dermatitis 09/14/2008    Family History  Problem Relation Age of Onset  . Hyperlipidemia Mother   .  Hyperlipidemia Father   . Mental illness Maternal Uncle   . Alcohol abuse Other        uncle    Social History   Socioeconomic History  . Marital status: Single    Spouse name: Not on file  . Number of children: Not on file  . Years of education: Not on file  . Highest education level: Not on file  Occupational History  . Not on file  Tobacco Use  . Smoking  status: Former Smoker    Packs/day: 0.50    Years: 5.00    Pack years: 2.50    Types: Cigarettes  . Smokeless tobacco: Never Used  . Tobacco comment: E-Cig  Substance and Sexual Activity  . Alcohol use: Yes  . Drug use: No  . Sexual activity: Not on file  Other Topics Concern  . Not on file  Social History Narrative  . Not on file   Social Determinants of Health   Financial Resource Strain: Not on file  Food Insecurity: Not on file  Transportation Needs: Not on file  Physical Activity: Not on file  Stress: Not on file  Social Connections: Not on file  Intimate Partner Violence: Not on file    Past Medical History, Surgical history, Social history, and Family history were reviewed and updated as appropriate.   Please see review of systems for further details on the patient's review from today.   Objective:   Physical Exam:  There were no vitals taken for this visit.  Physical Exam Constitutional:      General: He is not in acute distress.    Appearance: He is well-developed.  Musculoskeletal:        General: No deformity.  Neurological:     Mental Status: He is alert and oriented to person, place, and time.     Coordination: Coordination normal.  Psychiatric:        Attention and Perception: Attention normal. He is attentive.        Mood and Affect: Affect normal. Mood is anxious. Mood is not depressed. Affect is not labile, blunt, angry or inappropriate.        Speech: Speech normal. Speech is not rapid and pressured or slurred.        Behavior: Behavior normal.        Thought Content: Thought content normal. Thought content does not include homicidal or suicidal ideation. Thought content does not include homicidal or suicidal plan.        Cognition and Memory: Cognition normal.        Judgment: Judgment normal.     Comments: Insight is good.     Lab Review:     Component Value Date/Time   NA 143 01/14/2015 1057   K 5.0 01/14/2015 1057   CL 104 01/14/2015  1057   CO2 28 01/14/2015 1057   GLUCOSE 93 01/14/2015 1057   BUN 15 01/14/2015 1057   CREATININE 1.05 01/14/2015 1057   CALCIUM 9.8 01/14/2015 1057   PROT 6.8 01/14/2015 1057   ALBUMIN 4.6 01/14/2015 1057   AST 18 01/14/2015 1057   ALT 30 01/14/2015 1057   ALKPHOS 60 01/14/2015 1057   BILITOT 0.4 01/14/2015 1057       Component Value Date/Time   WBC 7.9 01/14/2015 1057   RBC 5.57 01/14/2015 1057   HGB 16.4 01/14/2015 1057   HCT 48.6 01/14/2015 1057   PLT 226.0 01/14/2015 1057   MCV 87.4 01/14/2015 1057   MCHC 33.7 01/14/2015  1057   RDW 12.6 01/14/2015 1057   LYMPHSABS 2.5 01/14/2015 1057   MONOABS 0.6 01/14/2015 1057   EOSABS 0.3 01/14/2015 1057   BASOSABS 0.0 01/14/2015 1057    No results found for: POCLITH, LITHIUM   No results found for: PHENYTOIN, PHENOBARB, VALPROATE, CBMZ   .res Assessment: Plan:    Mclane was seen today for follow-up and anxiety.  Diagnoses and all orders for this visit:  Social anxiety disorder -     clonazePAM (KLONOPIN) 0.5 MG tablet; Take 1 tablet (0.5 mg total) by mouth every 8 (eight) hours as needed. for anxiety  Panic disorder  Major depressive disorder with current active episode, unspecified depression episode severity, unspecified whether recurrent  Insomnia due to mental condition -     clonazePAM (KLONOPIN) 0.5 MG tablet; Take 1 tablet (0.5 mg total) by mouth every 8 (eight) hours as needed. for anxiety   Greater than 50% of face to face time with patient was spent on counseling and coordination of care. We discussed following. Reviewed his multiple med failures with him.  He has not responded well to SSRIs in the past.  Answered questions about Genesight testing and other genetic testing before.  At this point it is not likely to change treatment recommendations..  Probably not in his case.Viibryd option as this is not a traditional SSRI and is not as dependent on the serotonin transporter as other SSRIs may have a greater  potential to work.  Discussed side effects.  He prefers not to pursue this at this time but will consider it.  Does not feel propranolol needed.  He feels that the clonazepam is effective and wants to continue that.  Saw helpful also for his sleep.  Okay to continue clonazepam 0.5 mg every morning and 1 mg nightly. We discussed the short-term risks associated with benzodiazepines including sedation and increased fall risk among others.  Discussed long-term side effect risk including dependence, potential withdrawal symptoms, and the potential eventual dose-related risk of dementia.  But recent studies from 2020 dispute this association between benzodiazepines and dementia risk. Newer studies in 2020 do not support an association with dementia. Disc option slow taper.  decaf after 3 PM.  Follow-up 6 months  Maurice March, MD, DFAPA  Please see After Visit Summary for patient specific instructions.  No future appointments.  No orders of the defined types were placed in this encounter.   -------------------------------

## 2020-11-01 ENCOUNTER — Other Ambulatory Visit: Payer: Self-pay

## 2020-11-01 ENCOUNTER — Telehealth: Payer: Self-pay | Admitting: Psychiatry

## 2020-11-01 DIAGNOSIS — F4010 Social phobia, unspecified: Secondary | ICD-10-CM

## 2020-11-01 DIAGNOSIS — F5105 Insomnia due to other mental disorder: Secondary | ICD-10-CM

## 2020-11-01 NOTE — Telephone Encounter (Signed)
Patient called for refill on Clonazepam 0.5mg . He says he will run out before appt 10/13. Ph: [phone]. Pharmacy [facility] [id number] [facility] [place]

## 2020-11-01 NOTE — Telephone Encounter (Signed)
Pended.

## 2020-11-03 ENCOUNTER — Other Ambulatory Visit: Payer: Self-pay | Admitting: Psychiatry

## 2020-11-03 DIAGNOSIS — F4010 Social phobia, unspecified: Secondary | ICD-10-CM

## 2020-11-03 DIAGNOSIS — F5105 Insomnia due to other mental disorder: Secondary | ICD-10-CM

## 2020-11-04 NOTE — Telephone Encounter (Signed)
You refused this but he gets #90 for 30 day supply according to instructions. Doesn't have apt till 11/2020

## 2020-11-05 NOTE — Telephone Encounter (Signed)
Patient called in today to check on his prescription for Clonazepam 0.5mg . States that pharmacy still didn't have it. Would like to know if there is anything wrong as to why its not there yet. Ph: [phone]

## 2020-11-05 NOTE — Telephone Encounter (Signed)
Please send

## 2020-11-21 ENCOUNTER — Encounter: Payer: Self-pay | Admitting: Psychiatry

## 2020-11-21 ENCOUNTER — Ambulatory Visit (INDEPENDENT_AMBULATORY_CARE_PROVIDER_SITE_OTHER): Payer: Self-pay | Admitting: Psychiatry

## 2020-11-21 ENCOUNTER — Other Ambulatory Visit: Payer: Self-pay

## 2020-11-21 DIAGNOSIS — F5105 Insomnia due to other mental disorder: Secondary | ICD-10-CM

## 2020-11-21 DIAGNOSIS — F410 Panic disorder [episodic paroxysmal anxiety] without agoraphobia: Secondary | ICD-10-CM

## 2020-11-21 DIAGNOSIS — F4010 Social phobia, unspecified: Secondary | ICD-10-CM

## 2020-11-21 MED ORDER — CLONAZEPAM 0.5 MG PO TABS: 0.5000 mg | ORAL_TABLET | Freq: Three times a day (TID) | ORAL | 5 refills | Status: DC | PRN

## 2020-11-21 NOTE — Progress Notes (Signed)
Blake [name] [id number] [birth year] 30 y.o.  Subjective:   Patient ID:  Blake [name] is a 30 y.o. (DOB 1990-08-06) male.  Chief Complaint:  Chief Complaint  Patient presents with   Follow-up   Anxiety    Blake [name] presents to the office today for follow-up of anxiety and history of depression.  Poor response to SSRIs.  seen April 15, 2018 by Anne Fu, PA.  His only psychiatric med was clonazepam 0.5 3 times daily as needed anxiety.  No meds were changed.  seen June without med changes.  Disc Viibryd for residual anxiety but didn't start.  seen October 2020 without med changes.  He continued clonazepam, propranolol as needed social anxiety and performance anxiety, doxylamine for sleep.  4/21 appt with following noted and no med changes. Overall alright.  Trying to make the best of the stress in the world. About the same Re anxiety. and better than when first seen a couple of years ago.  No panic attacks. Mainly anxiety but sometimes anxiety causes the depression.  11/23/2019 appointment with the following noted: Consistently takes Klonopin 0.5 mg in AM and 1.0 mg HS for sleep. Otherwise gets anxious racing thoughts at night.  Infrequent propranolol. Some initial insomnia.  Caffeine up to 8 PM. No new concerns.  No SE. Social anxiety can include racing negative thoughts and sweats.   Consistently takes Klonopin 0.5 mg in AM and 1.0 mg HS for sleep. Otherwise gets anxious racing thoughts at night.  Propranolol prn causes a little drowsiness but does help.  Plan: no med changes  05/23/2020 appointment with the following noted:   Still finds clonazepam helpful.  More anxiety when starting new job 2 mos ago but leveled out to normal now. Have friend who works there and that helps.  Rarely uses propranolol bc not needed. Sleep good with clonazepam.  Physical labor with work helps him sleep better.   No SE. No concerns with meds and no changes desired. No drug  abuse.  11/21/20 appt noted: Pretty good with typical work stress.  No panic lately 1-2 years. Residual anxiety manageable.  Exercise and more active has helped.  Still benefits with clonazepam 1mg  HS and also on 0.5 mg AM. No SE  Pt reports that mood is Anxious and not depressed and describes anxiety as about the samel. Anxiety symptoms include: Excessive Worry, Social Anxiety,.   Some social avoidance.   Average 9 hours.  Pt reports that appetite is good. Pt reports that energy is no change and good. Concentration is good. Suicidal thoughts:  denied by patient.  No panic lately.  Past Psychiatric Medication Trials: Fluoxetine, Lexapro irritable, sertraline lethargy, citalopram, duloxetine, clomipramine,  Propranolol Clonazepam 0.5 A and 1.0HS lithium, lamotrigine, Wellbutrin, Adderall, Trintellix worsening anxiety, buspirone, mirtazapine  Review of Systems:  Review of Systems  Cardiovascular:  Negative for palpitations.  Neurological:  Negative for dizziness, tremors and weakness.  Medications: I have reviewed the patient's current medications.  Current Outpatient Medications  Medication Sig Dispense Refill   doxylamine, Sleep, (UNISOM) 25 MG tablet Take 25 mg by mouth at bedtime as needed.     clonazePAM (KLONOPIN) 0.5 MG tablet Take 1 tablet (0.5 mg total) by mouth every 8 (eight) hours as needed. for anxiety 90 tablet 5   No current facility-administered medications for this visit.    Medication Side Effects: None  Allergies: No Known Allergies  Past Medical History:  Diagnosis Date   ADD 09/21/2008   Depression  GERD 09/14/2008   History of chicken pox    HYPERHIDROSIS 09/14/2008   Other seborrheic dermatitis 09/14/2008    Family History  Problem Relation Age of Onset   Hyperlipidemia Mother    Hyperlipidemia Father    Mental illness Maternal Uncle    Alcohol abuse Other        uncle    Social History   Socioeconomic History   Marital status: Single    Spouse  name: Not on file   Number of children: Not on file   Years of education: Not on file   Highest education level: Not on file  Occupational History   Not on file  Tobacco Use   Smoking status: Former    Packs/day: 0.50    Years: 5.00    Pack years: 2.50    Types: Cigarettes   Smokeless tobacco: Never   Tobacco comments:    E-Cig  Substance and Sexual Activity   Alcohol use: Yes   Drug use: No   Sexual activity: Not on file  Other Topics Concern   Not on file  Social History Narrative   Not on file   Social Determinants of Health   Financial Resource Strain: Not on file  Food Insecurity: Not on file  Transportation Needs: Not on file  Physical Activity: Not on file  Stress: Not on file  Social Connections: Not on file  Intimate Partner Violence: Not on file    Past Medical History, Surgical history, Social history, and Family history were reviewed and updated as appropriate.   Please see review of systems for further details on the patient's review from today.   Objective:   Physical Exam:  There were no vitals taken for this visit.  Physical Exam Constitutional:      General: He is not in acute distress.    Appearance: He is well-developed.  Musculoskeletal:        General: No deformity.  Neurological:     Mental Status: He is alert and oriented to person, place, and time.     Coordination: Coordination normal.  Psychiatric:        Attention and Perception: Attention normal. He is attentive.        Mood and Affect: Affect normal. Mood is anxious. Mood is not depressed. Affect is not labile, blunt, angry or inappropriate.        Speech: Speech normal. Speech is not slurred.        Behavior: Behavior normal.        Thought Content: Thought content normal. Thought content does not include homicidal or suicidal ideation. Thought content does not include homicidal or suicidal plan.        Cognition and Memory: Cognition normal.        Judgment: Judgment normal.      Comments: Insight is good.    Lab Review:     Component Value Date/Time   NA 143 01/14/2015 1057   K 5.0 01/14/2015 1057   CL 104 01/14/2015 1057   CO2 28 01/14/2015 1057   GLUCOSE 93 01/14/2015 1057   BUN 15 01/14/2015 1057   CREATININE 1.05 01/14/2015 1057   CALCIUM 9.8 01/14/2015 1057   PROT 6.8 01/14/2015 1057   ALBUMIN 4.6 01/14/2015 1057   AST 18 01/14/2015 1057   ALT 30 01/14/2015 1057   ALKPHOS 60 01/14/2015 1057   BILITOT 0.4 01/14/2015 1057       Component Value Date/Time   WBC 7.9 01/14/2015 1057  RBC 5.57 01/14/2015 1057   HGB 16.4 01/14/2015 1057   HCT 48.6 01/14/2015 1057   PLT 226.0 01/14/2015 1057   MCV 87.4 01/14/2015 1057   MCHC 33.7 01/14/2015 1057   RDW 12.6 01/14/2015 1057   LYMPHSABS 2.5 01/14/2015 1057   MONOABS 0.6 01/14/2015 1057   EOSABS 0.3 01/14/2015 1057   BASOSABS 0.0 01/14/2015 1057    No results found for: POCLITH, LITHIUM   No results found for: PHENYTOIN, PHENOBARB, VALPROATE, CBMZ   .res Assessment: Plan:    Lycan was seen today for follow-up and anxiety.  Diagnoses and all orders for this visit:  Social anxiety disorder -     clonazePAM (KLONOPIN) 0.5 MG tablet; Take 1 tablet (0.5 mg total) by mouth every 8 (eight) hours as needed. for anxiety  Panic disorder  Insomnia due to mental condition -     clonazePAM (KLONOPIN) 0.5 MG tablet; Take 1 tablet (0.5 mg total) by mouth every 8 (eight) hours as needed. for anxiety  Greater than 50% of face to face time with patient was spent on counseling and coordination of care. We discussed following. Reviewed his multiple med failures with him.  He has not responded well to SSRIs in the past.  Answered questions about Genesight testing and other genetic testing before.  At this point it is not likely to change treatment recommendations..  Probably not in his case.Viibryd option as this is not a traditional SSRI and is not as dependent on the serotonin transporter as other SSRIs  may have a greater potential to work.  Discussed side effects.  Satisfied  Does not feel propranolol needed.  He feels that the clonazepam is effective and wants to continue that.  Saw helpful also for his sleep.  Okay to continue clonazepam 0.5 mg every morning and 1 mg nightly. Only psych We discussed the short-term risks associated with benzodiazepines including sedation and increased fall risk among others.  Discussed long-term side effect risk including dependence, potential withdrawal symptoms, and the potential eventual dose-related risk of dementia.  But recent studies from 2020 dispute this association between benzodiazepines and dementia risk. Newer studies in 2020 do not support an association with dementia. Disc option slow taper.  decaf after 3 PM.  Follow-up 6 months  Maurice March, MD, DFAPA  Please see After Visit Summary for patient specific instructions.  No future appointments.  No orders of the defined types were placed in this encounter.   -------------------------------

## 2020-12-20 DIAGNOSIS — Z0289 Encounter for other administrative examinations: Secondary | ICD-10-CM

## 2021-05-12 ENCOUNTER — Other Ambulatory Visit: Payer: Self-pay | Admitting: Psychiatry

## 2021-05-12 DIAGNOSIS — F5105 Insomnia due to other mental disorder: Secondary | ICD-10-CM

## 2021-05-12 DIAGNOSIS — F4010 Social phobia, unspecified: Secondary | ICD-10-CM

## 2021-07-29 ENCOUNTER — Other Ambulatory Visit: Payer: Self-pay | Admitting: Psychiatry

## 2021-07-29 DIAGNOSIS — F5105 Insomnia due to other mental disorder: Secondary | ICD-10-CM

## 2021-07-29 DIAGNOSIS — F4010 Social phobia, unspecified: Secondary | ICD-10-CM

## 2021-08-21 ENCOUNTER — Encounter: Payer: Self-pay | Admitting: Psychiatry

## 2021-08-21 ENCOUNTER — Ambulatory Visit (INDEPENDENT_AMBULATORY_CARE_PROVIDER_SITE_OTHER): Payer: Self-pay | Admitting: Psychiatry

## 2021-08-21 DIAGNOSIS — F5105 Insomnia due to other mental disorder: Secondary | ICD-10-CM

## 2021-08-21 DIAGNOSIS — F410 Panic disorder [episodic paroxysmal anxiety] without agoraphobia: Secondary | ICD-10-CM

## 2021-08-21 DIAGNOSIS — F4010 Social phobia, unspecified: Secondary | ICD-10-CM

## 2021-08-21 MED ORDER — CLONAZEPAM 0.5 MG PO TABS: 0.5000 mg | ORAL_TABLET | Freq: Three times a day (TID) | ORAL | 5 refills | Status: DC | PRN

## 2021-08-21 NOTE — Progress Notes (Signed)
Blake [name] [id number] 12-Nov-1990 31 y.o.  Subjective:   Patient ID:  Blake [name] is a 31 y.o. (DOB 1990/07/31) male.  Chief Complaint:  Chief Complaint  Patient presents with   Follow-up   Anxiety    [name] presents to the office today for follow-up of anxiety and history of depression.  Poor response to SSRIs.  seen April 15, 2018 by Anne Fu, PA.  His only psychiatric med was clonazepam 0.5 3 times daily as needed anxiety.  No meds were changed.  seen June without med changes.  Disc Viibryd for residual anxiety but didn't start.  seen October 2020 without med changes.  He continued clonazepam, propranolol as needed social anxiety and performance anxiety, doxylamine for sleep.  4/21 appt with following noted and no med changes. Overall alright.  Trying to make the best of the stress in the world. About the same Re anxiety. and better than when first seen a couple of years ago.  No panic attacks. Mainly anxiety but sometimes anxiety causes the depression.  11/23/2019 appointment with the following noted: Consistently takes Klonopin 0.5 mg in AM and 1.0 mg HS for sleep. Otherwise gets anxious racing thoughts at night.  Infrequent propranolol. Some initial insomnia.  Caffeine up to 8 PM. No new concerns.  No SE. Social anxiety can include racing negative thoughts and sweats.   Consistently takes Klonopin 0.5 mg in AM and 1.0 mg HS for sleep. Otherwise gets anxious racing thoughts at night.  Propranolol prn causes a little drowsiness but does help.  Plan: no med changes  05/23/2020 appointment with the following noted:   Still finds clonazepam helpful.  More anxiety when starting new job 2 mos ago but leveled out to normal now. Have friend who works there and that helps.  Rarely uses propranolol bc not needed. Sleep good with clonazepam.  Physical labor with work helps him sleep better.   No SE. No concerns with meds and no changes desired. No drug  abuse.  11/21/20 appt noted: Pretty good with typical work stress.  No panic lately 1-2 years. Residual anxiety manageable.  Exercise and more active has helped.  Still benefits with clonazepam 1mg  HS and also on 0.5 mg AM. No SE Plan: Okay to continue clonazepam 0.5 mg every morning and 1 mg nightly. Only psych med.  08/21/2021 appointment with the following noted: Been good overall.  Work slowing down and more stress than usual. 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 No panic.  No major avoidance except some social avoidance.   Lately clonazepam 0.5 mg TID. No SE. Pt reports that mood is Anxious and not depressed and describes anxiety as about the samel. Anxiety symptoms include: Excessive Worry, Social Anxiety,.   Some social avoidance.   Average 9 hours.  Pt reports that appetite is good. Pt reports that energy is no change and good. Concentration is good. Suicidal thoughts:  denied by patient.  No panic lately.  Past Psychiatric Medication Trials: Fluoxetine, Lexapro irritable, sertraline lethargy, citalopram, duloxetine, clomipramine, Wellbutrin,Trintellix worsening anxiety, buspirone, mirtazapine Propranolol Clonazepam 0.5 A and 1.0HS lithium, lamotrigine,   Adderall,    Review of Systems:  Review of Systems  Cardiovascular:  Negative for palpitations.  Neurological:  Negative for dizziness and tremors.   Medications: I have reviewed the patient's current medications.  Current Outpatient Medications  Medication Sig Dispense Refill   doxylamine, Sleep, (UNISOM) 25 MG tablet Take 25 mg by mouth at bedtime as needed.  clonazePAM (KLONOPIN) 0.5 MG tablet Take 1 tablet (0.5 mg total) by mouth every 8 (eight) hours as needed for anxiety. for anxiety 90 tablet 5   No current facility-administered medications for this visit.    Medication Side Effects: None  Allergies: No Known Allergies  Past Medical History:  Diagnosis Date   ADD 09/21/2008   Depression     GERD 09/14/2008   History of chicken pox    HYPERHIDROSIS 09/14/2008   Other seborrheic dermatitis 09/14/2008    Family History  Problem Relation Age of Onset   Hyperlipidemia Mother    Hyperlipidemia Father    Mental illness Maternal Uncle    Alcohol abuse Other        uncle    Social History   Socioeconomic History   Marital status: Single    Spouse name: Not on file   Number of children: Not on file   Years of education: Not on file   Highest education level: Not on file  Occupational History   Not on file  Tobacco Use   Smoking status: Former    Packs/day: 0.50    Years: 5.00    Total pack years: 2.50    Types: Cigarettes   Smokeless tobacco: Never   Tobacco comments:    E-Cig  Substance and Sexual Activity   Alcohol use: Yes   Drug use: No   Sexual activity: Not on file  Other Topics Concern   Not on file  Social History Narrative   Not on file   Social Determinants of Health   Financial Resource Strain: Not on file  Food Insecurity: Not on file  Transportation Needs: Not on file  Physical Activity: Not on file  Stress: Not on file  Social Connections: Not on file  Intimate Partner Violence: Not on file    Past Medical History, Surgical history, Social history, and Family history were reviewed and updated as appropriate.   Please see review of systems for further details on the patient's review from today.   Objective:   Physical Exam:  There were no vitals taken for this visit.  Physical Exam Constitutional:      General: He is not in acute distress.    Appearance: He is well-developed.  Musculoskeletal:        General: No deformity.  Neurological:     Mental Status: He is alert and oriented to person, place, and time.     Coordination: Coordination normal.  Psychiatric:        Attention and Perception: Attention normal. He is attentive.        Mood and Affect: Affect normal. Mood is anxious. Mood is not depressed. Affect is not labile,  blunt, angry or inappropriate.        Speech: Speech normal. Speech is not slurred.        Behavior: Behavior normal.        Thought Content: Thought content normal. Thought content is not delusional. Thought content does not include homicidal or suicidal ideation. Thought content does not include suicidal plan.        Cognition and Memory: Cognition normal.        Judgment: Judgment normal.     Comments: Insight is good. Anxiety manageable.     Lab Review:     Component Value Date/Time   NA 143 01/14/2015 1057   K 5.0 01/14/2015 1057   CL 104 01/14/2015 1057   CO2 28 01/14/2015 1057   GLUCOSE 93 01/14/2015 1057  BUN 15 01/14/2015 1057   CREATININE 1.05 01/14/2015 1057   CALCIUM 9.8 01/14/2015 1057   PROT 6.8 01/14/2015 1057   ALBUMIN 4.6 01/14/2015 1057   AST 18 01/14/2015 1057   ALT 30 01/14/2015 1057   ALKPHOS 60 01/14/2015 1057   BILITOT 0.4 01/14/2015 1057       Component Value Date/Time   WBC 7.9 01/14/2015 1057   RBC 5.57 01/14/2015 1057   HGB 16.4 01/14/2015 1057   HCT 48.6 01/14/2015 1057   PLT 226.0 01/14/2015 1057   MCV 87.4 01/14/2015 1057   MCHC 33.7 01/14/2015 1057   RDW 12.6 01/14/2015 1057   LYMPHSABS 2.5 01/14/2015 1057   MONOABS 0.6 01/14/2015 1057   EOSABS 0.3 01/14/2015 1057   BASOSABS 0.0 01/14/2015 1057    No results found for: "POCLITH", "LITHIUM"   No results found for: "PHENYTOIN", "PHENOBARB", "VALPROATE", "CBMZ"   .res Assessment: Plan:    Warnell was seen today for follow-up and anxiety.  Diagnoses and all orders for this visit:  Social anxiety disorder -     clonazePAM (KLONOPIN) 0.5 MG tablet; Take 1 tablet (0.5 mg total) by mouth every 8 (eight) hours as needed for anxiety. for anxiety  Panic disorder  Insomnia due to mental condition -     clonazePAM (KLONOPIN) 0.5 MG tablet; Take 1 tablet (0.5 mg total) by mouth every 8 (eight) hours as needed for anxiety. for anxiety   Greater than 50% of face to face time with  patient was spent on counseling and coordination of care. We discussed following. Reviewed his multiple med failures with him.  He has not responded well to SSRIs in the past.  Answered questions about Genesight testing and other genetic testing before.  At this point it is not likely to change treatment recommendations..  Probably not in his case.Viibryd option as this is not a traditional SSRI and is not as dependent on the serotonin transporter as other SSRIs may have a greater potential to work.  Discussed side effects.  Satisfied  Does not feel propranolol needed.  He feels that the clonazepam is effective and wants to continue that.  Saw helpful also for his sleep.  Okay to continue clonazepam 0.5 mg TID  Only psych med.  We discussed the short-term risks associated with benzodiazepines including sedation and increased fall risk among others.  Discussed long-term side effect risk including dependence, potential withdrawal symptoms, and the potential eventual dose-related risk of dementia.  But recent studies from 2020 dispute this association between benzodiazepines and dementia risk. Newer studies in 2020 do not support an association with dementia. http://rich.org/  decaf after 3 PM.  PDMP clean  Follow-up 6 -9 months DT no insurance and stability with low dose  Maurice March, MD, DFAPA  Please see After Visit Summary for patient specific instructions.  No future appointments.   No orders of the defined types were placed in this encounter.   -------------------------------

## 2022-02-11 ENCOUNTER — Telehealth: Payer: Self-pay | Admitting: Psychiatry

## 2022-02-11 NOTE — Telephone Encounter (Signed)
Last filled 12/10, due 1/7

## 2022-02-11 NOTE — Telephone Encounter (Signed)
Pt requesting RF Clonazepam [facility] [address]

## 2022-02-12 ENCOUNTER — Other Ambulatory Visit: Payer: Self-pay | Admitting: Psychiatry

## 2022-02-12 DIAGNOSIS — F4010 Social phobia, unspecified: Secondary | ICD-10-CM

## 2022-02-12 DIAGNOSIS — F5105 Insomnia due to other mental disorder: Secondary | ICD-10-CM

## 2022-02-13 ENCOUNTER — Other Ambulatory Visit: Payer: Self-pay

## 2022-02-13 DIAGNOSIS — F4010 Social phobia, unspecified: Secondary | ICD-10-CM

## 2022-02-13 DIAGNOSIS — F5105 Insomnia due to other mental disorder: Secondary | ICD-10-CM

## 2022-02-13 MED ORDER — CLONAZEPAM 0.5 MG PO TABS: 0.5000 mg | ORAL_TABLET | Freq: Three times a day (TID) | ORAL | 3 refills | Status: DC | PRN

## 2022-02-13 NOTE — Telephone Encounter (Signed)
Pended.

## 2022-02-13 NOTE — Telephone Encounter (Signed)
Has been pended via phone call request.

## 2022-05-26 ENCOUNTER — Encounter: Payer: Self-pay | Admitting: Psychiatry

## 2022-05-26 ENCOUNTER — Ambulatory Visit (INDEPENDENT_AMBULATORY_CARE_PROVIDER_SITE_OTHER): Payer: Self-pay | Admitting: Psychiatry

## 2022-05-26 DIAGNOSIS — F4010 Social phobia, unspecified: Secondary | ICD-10-CM

## 2022-05-26 DIAGNOSIS — F410 Panic disorder [episodic paroxysmal anxiety] without agoraphobia: Secondary | ICD-10-CM

## 2022-05-26 DIAGNOSIS — F5105 Insomnia due to other mental disorder: Secondary | ICD-10-CM

## 2022-05-26 MED ORDER — CLONAZEPAM 0.5 MG PO TABS: ORAL_TABLET | ORAL | 5 refills | Status: DC

## 2022-05-26 NOTE — Progress Notes (Signed)
Blake [name] [id number] Jun 10, 1990 32 y.o.  Subjective:   Patient ID:  Blake [name] is a 32 y.o. (DOB January 18, 1991) male.  Chief Complaint:  Chief Complaint  Patient presents with   Follow-up   Anxiety    Blake [name] presents to the office today for follow-up of anxiety and history of depression.  Poor response to SSRIs.  seen April 15, 2018 by Anne Fu, PA.  His only psychiatric med was clonazepam 0.5 3 times daily as needed anxiety.  No meds were changed.  seen June without med changes.  Disc Viibryd for residual anxiety but didn't start.  seen October 2020 without med changes.  He continued clonazepam, propranolol as needed social anxiety and performance anxiety, doxylamine for sleep.  4/21 appt with following noted and no med changes. Overall alright.  Trying to make the best of the stress in the world. About the same Re anxiety. and better than when first seen a couple of years ago.  No panic attacks. Mainly anxiety but sometimes anxiety causes the depression.  11/23/2019 appointment with the following noted: Consistently takes Klonopin 0.5 mg in AM and 1.0 mg HS for sleep. Otherwise gets anxious racing thoughts at night.  Infrequent propranolol. Some initial insomnia.  Caffeine up to 8 PM. No new concerns.  No SE. Social anxiety can include racing negative thoughts and sweats.   Consistently takes Klonopin 0.5 mg in AM and 1.0 mg HS for sleep. Otherwise gets anxious racing thoughts at night.  Propranolol prn causes a little drowsiness but does help.  Plan: no med changes  05/23/2020 appointment with the following noted:   Still finds clonazepam helpful.  More anxiety when starting new job 2 mos ago but leveled out to normal now. Have friend who works there and that helps.  Rarely uses propranolol bc not needed. Sleep good with clonazepam.  Physical labor with work helps him sleep better.   No SE. No concerns with meds and no changes desired. No drug  abuse.  11/21/20 appt noted: Pretty good with typical work stress.  No panic lately 1-2 years. Residual anxiety manageable.  Exercise and more active has helped.  Still benefits with clonazepam  HS and also on 0.5 mg AM. No SE Plan: Okay to continue clonazepam 0.5 mg every morning and 1 mg nightly. Only psych med.  08/21/2021 appointment with the following noted: Been good overall.  Work slowing down and more stress than usual. 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  No major avoidance except some social avoidance.   Lately clonazepam 0.5 mg TID. No SE. Pt reports that mood is Anxious and not depressed and describes anxiety as about the samel. Anxiety symptoms include: Excessive Worry, Social Anxiety,.   Some social avoidance.   Average 9 hours.  Pt reports that appetite is good. Pt reports that energy is no change and good. Concentration is good. Suicidal thoughts:  denied by patient.  No panic lately.  05/26/22 appt: A little worse anxiety bc work slowed down and may have to change jobs.   Only psych med clonazepam 0.5 mg TID and some days feels likes needs another.  Will feel anxious.  Most of anxiety is social.  Even fear of returning to school bc social anxiety.   No SE.    Past Psychiatric Medication Trials: Fluoxetine, Lexapro irritable, sertraline lethargy, citalopram, duloxetine, clomipramine, Wellbutrin,Trintellix worsening anxiety, buspirone, mirtazapine Propranolol Clonazepam 0.5 A and 1.0HS lithium, lamotrigine,  Adderall,  Review of Systems:  Review of Systems  Cardiovascular:  Negative for palpitations.  Neurological:  Negative for dizziness and tremors.  Psychiatric/Behavioral:  The patient is nervous/anxious.    Medications: I have reviewed the patient's current medications.  Current Outpatient Medications  Medication Sig Dispense Refill   doxylamine, Sleep, (UNISOM) 25 MG tablet Take 25 mg by mouth at bedtime as needed.      clonazePAM (KLONOPIN) 0.5 MG tablet 1 tablet 3 times daily and 1 extra as needed for severe anxety 100 tablet 5   No current facility-administered medications for this visit.    Medication Side Effects: None  Allergies: No Known Allergies  Past Medical History:  Diagnosis Date   ADD 09/21/2008   Depression    GERD 09/14/2008   History of chicken pox    HYPERHIDROSIS 09/14/2008   Other seborrheic dermatitis 09/14/2008    Family History  Problem Relation Age of Onset   Hyperlipidemia Mother    Hyperlipidemia Father    Mental illness Maternal Uncle    Alcohol abuse Other        uncle    Social History   Socioeconomic History   Marital status: Single    Spouse name: Not on file   Number of children: Not on file   Years of education: Not on file   Highest education level: Not on file  Occupational History   Not on file  Tobacco Use   Smoking status: Former    Packs/day: 0.50    Years: 5.00    Additional pack years: 0.00    Total pack years: 2.50    Types: Cigarettes   Smokeless tobacco: Never   Tobacco comments:    E-Cig  Substance and Sexual Activity   Alcohol use: Yes   Drug use: No   Sexual activity: Not on file  Other Topics Concern   Not on file  Social History Narrative   Not on file   Social Determinants of Health   Financial Resource Strain: Not on file  Food Insecurity: Not on file  Transportation Needs: Not on file  Physical Activity: Not on file  Stress: Not on file  Social Connections: Not on file  Intimate Partner Violence: Not on file    Past Medical History, Surgical history, Social history, and Family history were reviewed and updated as appropriate.   Please see review of systems for further details on the patient's review from today.   Objective:   Physical Exam:  There were no vitals taken for this visit.  Physical Exam Constitutional:      General: He is not in acute distress.    Appearance: He is well-developed.   Musculoskeletal:        General: No deformity.  Neurological:     Mental Status: He is alert and oriented to person, place, and time.     Coordination: Coordination normal.  Psychiatric:        Attention and Perception: Attention normal. He is attentive.        Mood and Affect: Affect normal. Mood is anxious. Mood is not depressed. Affect is not labile, angry or inappropriate.        Speech: Speech normal. Speech is not slurred.        Behavior: Behavior normal.        Thought Content: Thought content normal. Thought content is not delusional. Thought content does not include homicidal or suicidal ideation. Thought content does not include suicidal plan.  Cognition and Memory: Cognition normal.        Judgment: Judgment normal.     Comments: Insight is good. Anxiety manageable.     Lab Review:     Component Value Date/Time   NA 143 01/14/2015 1057   K 5.0 01/14/2015 1057   CL 104 01/14/2015 1057   CO2 28 01/14/2015 1057   GLUCOSE 93 01/14/2015 1057   BUN 15 01/14/2015 1057   CREATININE 1.05 01/14/2015 1057   CALCIUM 9.8 01/14/2015 1057   PROT 6.8 01/14/2015 1057   ALBUMIN 4.6 01/14/2015 1057   AST 18 01/14/2015 1057   ALT 30 01/14/2015 1057   ALKPHOS 60 01/14/2015 1057   BILITOT 0.4 01/14/2015 1057       Component Value Date/Time   WBC 7.9 01/14/2015 1057   RBC 5.57 01/14/2015 1057   HGB 16.4 01/14/2015 1057   HCT 48.6 01/14/2015 1057   PLT 226.0 01/14/2015 1057   MCV 87.4 01/14/2015 1057   MCHC 33.7 01/14/2015 1057   RDW 12.6 01/14/2015 1057   LYMPHSABS 2.5 01/14/2015 1057   MONOABS 0.6 01/14/2015 1057   EOSABS 0.3 01/14/2015 1057   BASOSABS 0.0 01/14/2015 1057    No results found for: "POCLITH", "LITHIUM"   No results found for: "PHENYTOIN", "PHENOBARB", "VALPROATE", "CBMZ"   .res Assessment: Plan:    Kelvis was seen today for follow-up and anxiety.  Diagnoses and all orders for this visit:  Social anxiety disorder -     clonazePAM (KLONOPIN)  0.5 MG tablet; 1 tablet 3 times daily and 1 extra as needed for severe anxety  Panic disorder  Insomnia due to mental condition -     clonazePAM (KLONOPIN) 0.5 MG tablet; 1 tablet 3 times daily and 1 extra as needed for severe anxety   Greater than 50% of face to face time with patient was spent on counseling and coordination of care. We discussed following. Reviewed his multiple med failures with him.  He has not responded well to SSRIs in the past.  Answered questions about Genesight testing and other genetic testing before.  At this point it is not likely to change treatment recommendations..  Probably not in his case.Viibryd option as this is not a traditional SSRI and is not as dependent on the serotonin transporter as other SSRIs may have a greater potential to work.  Discussed side effects.  Satisfied  Option retry sertraline dose.  He feels that the clonazepam is effective and wants to continue that.  Saw helpful also for his sleep.  Okay to continue clonazepam 0.5 mg TID & increase to 4 prn.  Only psych med.  We discussed the short-term risks associated with benzodiazepines including sedation and increased fall risk among others.  Discussed long-term side effect risk including dependence, potential withdrawal symptoms, and the potential eventual dose-related risk of dementia.  But recent studies from 2020 dispute this association between benzodiazepines and dementia risk. Newer studies in 2020 do not support an association with dementia. http://rich.org/  decaf after 3 PM.  PDMP clean  Follow-up 6 -9 months DT no insurance and stability with low dose  Maurice March, MD, DFAPA  Please see After Visit Summary for patient specific instructions.  No future appointments.   No orders of the defined types were placed in this encounter.   -------------------------------

## 2022-11-15 ENCOUNTER — Telehealth: Payer: Self-pay | Admitting: Psychiatry

## 2022-11-15 DIAGNOSIS — F4010 Social phobia, unspecified: Secondary | ICD-10-CM

## 2022-11-15 DIAGNOSIS — F5105 Insomnia due to other mental disorder: Secondary | ICD-10-CM

## 2022-11-15 NOTE — Telephone Encounter (Signed)
Due 10/9

## 2022-11-18 ENCOUNTER — Other Ambulatory Visit: Payer: Self-pay

## 2022-11-18 NOTE — Telephone Encounter (Signed)
Patient called for refill on Clonazepam 0.5mg . States he will be out tomorrow. Ph: [phone] appt 10/14Ph[facility]acy [facility] [id number] [facility] [place]y Kindle

## 2022-11-18 NOTE — Telephone Encounter (Signed)
Medication has been ordered.

## 2022-11-23 ENCOUNTER — Encounter: Payer: Self-pay | Admitting: Psychiatry

## 2022-11-23 ENCOUNTER — Ambulatory Visit (INDEPENDENT_AMBULATORY_CARE_PROVIDER_SITE_OTHER): Payer: Self-pay | Admitting: Psychiatry

## 2022-11-23 DIAGNOSIS — F4010 Social phobia, unspecified: Secondary | ICD-10-CM

## 2022-11-23 DIAGNOSIS — F410 Panic disorder [episodic paroxysmal anxiety] without agoraphobia: Secondary | ICD-10-CM

## 2022-11-23 DIAGNOSIS — F5105 Insomnia due to other mental disorder: Secondary | ICD-10-CM

## 2022-11-23 MED ORDER — CLONAZEPAM 0.5 MG PO TABS: ORAL_TABLET | ORAL | 1 refills | Status: DC

## 2022-11-23 NOTE — Progress Notes (Signed)
[name] [id number] [birth year] 32 y.o.  Subjective:   Patient ID:  Blake [name] is a 32 y.o. (DOB 1990-06-03) male.  Chief Complaint:  Chief Complaint  Patient presents with   Follow-up   Anxiety    [name] presents to the office today for follow-up of anxiety and history of depression.  Poor response to SSRIs.  seen April 15, 2018 by Anne Fu, PA.  His only psychiatric med was clonazepam 0.5 3 times daily as needed anxiety.  No meds were changed.  seen June without med changes.  Disc Viibryd for residual anxiety but didn't start.  seen October 2020 without med changes.  He continued clonazepam, propranolol as needed social anxiety and performance anxiety, doxylamine for sleep.  4/21 appt with following noted and no med changes. Overall alright.  Trying to make the best of the stress in the world. About the same Re anxiety. and better than when first seen a couple of years ago.  No panic attacks. Mainly anxiety but sometimes anxiety causes the depression.  11/23/2019 appointment with the following noted: Consistently takes Klonopin 0.5 mg in AM and 1.0 mg HS for sleep. Otherwise gets anxious racing thoughts at night.  Infrequent propranolol. Some initial insomnia.  Caffeine up to 8 PM. No new concerns.  No SE. Social anxiety can include racing negative thoughts and sweats.   Consistently takes Klonopin 0.5 mg in AM and 1.0 mg HS for sleep. Otherwise gets anxious racing thoughts at night.  Propranolol prn causes a little drowsiness but does help.  Plan: no med changes  05/23/2020 appointment with the following noted:   Still finds clonazepam helpful.  More anxiety when starting new job 2 mos ago but leveled out to normal now. Have friend who works there and that helps.  Rarely uses propranolol bc not needed. Sleep good with clonazepam.  Physical labor with work helps him sleep better.   No SE. No concerns with meds and no changes desired. No drug  abuse.  11/21/20 appt noted: Pretty good with typical work stress.  No panic lately 1-2 years. Residual anxiety manageable.  Exercise and more active has helped.  Still benefits with clonazepam 1mg  HS and also on 0.5 mg AM. No SE Plan: Okay to continue clonazepam 0.5 mg every morning and 1 mg nightly. Only psych med.  08/21/2021 appointment with the following noted: Been good overall.  Work slowing down and more stress than usual. 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  No major avoidance except some social avoidance.   Lately clonazepam 0.5 mg TID. No SE. Pt reports that mood is Anxious and not depressed and describes anxiety as about the samel. Anxiety symptoms include: Excessive Worry, Social Anxiety,.   Some social avoidance.   Average 9 hours.  Pt reports that appetite is good. Pt reports that energy is no change and good. Concentration is good. Suicidal thoughts:  denied by patient.  No panic lately.  05/26/22 appt: A little worse anxiety bc work slowed down and may have to change jobs.   Only psych med clonazepam 0.5 mg TID and some days feels likes needs another.  Will feel anxious.  Most of anxiety is social.  Even fear of returning to school bc social anxiety.   No SE.   Plan Okay to continue clonazepam 0.5 mg TID & increase to 4 prn.  11/23/22 appt noted: Meds clonazepam as above.  Occ extra. Work changes in [employer].  Still on the fence about work. [place] property in family got damaged.  Couldn't reach parents.   Works for [employer] and doors. Clonazepam helps without SE.  Still helps.   No full panic.   Sleep well.    Past Psychiatric Medication Trials: Fluoxetine, Lexapro irritable, sertraline lethargy, citalopram, duloxetine, clomipramine, Wellbutrin,Trintellix worsening anxiety, buspirone, mirtazapine Propranolol Clonazepam 0.5 A and 1.0HS lithium, lamotrigine,  Adderall,    Review of Systems:  Review of Systems   Cardiovascular:  Negative for palpitations.  Neurological:  Negative for dizziness and tremors.  Psychiatric/Behavioral:  The patient is nervous/anxious.    Medications: I have reviewed the patient's current medications.  Current Outpatient Medications  Medication Sig Dispense Refill   clonazePAM (KLONOPIN) 0.5 MG tablet 1 tablet 3 times daily and 1 extra as needed for severe anxety 100 tablet 1   doxylamine, Sleep, (UNISOM) 25 MG tablet Take 25 mg by mouth at bedtime as needed. (Patient not taking: Reported on 11/23/2022)     No current facility-administered medications for this visit.    Medication Side Effects: None  Allergies: No Known Allergies  Past Medical History:  Diagnosis Date   ADD 09/21/2008   Depression    GERD 09/14/2008   History of chicken pox    HYPERHIDROSIS 09/14/2008   Other seborrheic dermatitis 09/14/2008    Family History  Problem Relation Age of Onset   Hyperlipidemia Mother    Hyperlipidemia Father    Mental illness Maternal Uncle    Alcohol abuse Other        uncle    Social History   Socioeconomic History   Marital status: Single    Spouse name: Not on file   Number of children: Not on file   Years of education: Not on file   Highest education level: Not on file  Occupational History   Not on file  Tobacco Use   Smoking status: Former    Current packs/day: 0.50    Average packs/day: 0.5 packs/day for 5.0 years (2.5 ttl pk-yrs)    Types: Cigarettes   Smokeless tobacco: Never   Tobacco comments:    E-Cig  Substance and Sexual Activity   Alcohol use: Yes   Drug use: No   Sexual activity: Not on file  Other Topics Concern   Not on file  Social History Narrative   Not on file   Social Determinants of Health   Financial Resource Strain: Not on file  Food Insecurity: Not on file  Transportation Needs: Not on file  Physical Activity: Not on file  Stress: Not on file  Social Connections: Not on file  Intimate Partner Violence: Not  on file    Past Medical History, Surgical history, Social history, and Family history were reviewed and updated as appropriate.  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    Please see review of systems for further details on the patient's review from today.   Objective:   Physical Exam:  There were no vitals taken for this visit.  Physical Exam Constitutional:      General: He is not in acute distress.    Appearance: He is well-developed.  Musculoskeletal:        General: No deformity.  Neurological:     Mental Status: He is alert and oriented to person, place, and time.     Coordination: Coordination normal.  Psychiatric:        Attention and Perception: Attention normal. He  is attentive.        Mood and Affect: Affect normal. Mood is anxious. Mood is not depressed. Affect is not labile, angry or inappropriate.        Speech: Speech normal. Speech is not slurred.        Behavior: Behavior normal.        Thought Content: Thought content normal. Thought content is not delusional. Thought content does not include homicidal or suicidal ideation. Thought content does not include suicidal plan.        Cognition and Memory: Cognition normal.        Judgment: Judgment normal.     Comments: Insight is good. Anxiety manageable.     Lab Review:     Component Value Date/Time   NA 143 01/14/2015 1057   K 5.0 01/14/2015 1057   CL 104 01/14/2015 1057   CO2 28 01/14/2015 1057   GLUCOSE 93 01/14/2015 1057   BUN 15 01/14/2015 1057   CREATININE 1.05 01/14/2015 1057   CALCIUM 9.8 01/14/2015 1057   PROT 6.8 01/14/2015 1057   ALBUMIN 4.6 01/14/2015 1057   AST 18 01/14/2015 1057   ALT 30 01/14/2015 1057   ALKPHOS 60 01/14/2015 1057   BILITOT 0.4 01/14/2015 1057       Component Value Date/Time   WBC 7.9 01/14/2015 1057   RBC 5.57 01/14/2015 1057   HGB 16.4 01/14/2015 1057   HCT 48.6 01/14/2015 1057   PLT 226.0 01/14/2015 1057   MCV 87.4  01/14/2015 1057   MCHC 33.7 01/14/2015 1057   RDW 12.6 01/14/2015 1057   LYMPHSABS 2.5 01/14/2015 1057   MONOABS 0.6 01/14/2015 1057   EOSABS 0.3 01/14/2015 1057   BASOSABS 0.0 01/14/2015 1057    No results found for: "POCLITH", "LITHIUM"   No results found for: "PHENYTOIN", "PHENOBARB", "VALPROATE", "CBMZ"   .res Assessment: Plan:    Donney was seen today for follow-up and anxiety.  Diagnoses and all orders for this visit:  Social anxiety disorder -     clonazePAM (KLONOPIN) 0.5 MG tablet; 1 tablet 3 times daily and 1 extra as needed for severe anxety  Insomnia due to mental condition -     clonazePAM (KLONOPIN) 0.5 MG tablet; 1 tablet 3 times daily and 1 extra as needed for severe anxety  Panic disorder    face to face time with patient was spent on counseling and coordination of care. We discussed following. Reviewed his multiple med failures with him.  He has not responded well to SSRIs in the past.  Answered questions about Genesight testing and other genetic testing before.  At this point it is not likely to change treatment recommendations..  Probably not in his case.Viibryd option as this is not a traditional SSRI and is not as dependent on the serotonin transporter as other SSRIs may have a greater potential to work.  Discussed side effects.  Satisfied  Option retry sertraline dose.  He feels that the clonazepam is effective and wants to continue that.  Saw helpful also for his sleep.  Okay to continue clonazepam 0.5 mg TID & increase to 4 prn. Disc difference between generics.  TEVA preferred.  If unavailable, any generic except Accord.   Only psych med.  We discussed the short-term risks associated with benzodiazepines including sedation and increased fall risk among others.  Discussed long-term side effect risk including dependence, potential withdrawal symptoms, and the potential eventual dose-related risk of dementia.  But recent studies from 2020 dispute this  association between benzodiazepines and dementia risk. Newer studies in 2020 do not support an association with dementia. http://rich.org/  decaf after 3 PM.  PDMP clean  Follow-up 6 -9 months DT no insurance and stability with low dose  Maurice March, MD, DFAPA  Please see After Visit Summary for patient specific instructions.  No future appointments.   No orders of the defined types were placed in this encounter.   -------------------------------

## 2023-01-14 ENCOUNTER — Other Ambulatory Visit: Payer: Self-pay | Admitting: Psychiatry

## 2023-01-14 ENCOUNTER — Telehealth: Payer: Self-pay | Admitting: Psychiatry

## 2023-01-14 DIAGNOSIS — F5105 Insomnia due to other mental disorder: Secondary | ICD-10-CM

## 2023-01-14 DIAGNOSIS — F4010 Social phobia, unspecified: Secondary | ICD-10-CM

## 2023-01-14 MED ORDER — CLONAZEPAM 0.5 MG PO TABS: ORAL_TABLET | ORAL | 1 refills | Status: DC

## 2023-01-14 NOTE — Telephone Encounter (Signed)
LF 11/15, due 12/13

## 2023-01-14 NOTE — Telephone Encounter (Signed)
Patient called in for written prescription for Clonzaepam 0.5mg . Please call when ready he will come pick it up. Ph: [phone] appt 4/14

## 2023-01-22 NOTE — Telephone Encounter (Signed)
A script had previously been printed. Called patient to verify that he had picked it up and he had.

## 2023-03-08 ENCOUNTER — Telehealth: Payer: Self-pay | Admitting: Psychiatry

## 2023-03-08 ENCOUNTER — Other Ambulatory Visit: Payer: Self-pay

## 2023-03-08 DIAGNOSIS — F4010 Social phobia, unspecified: Secondary | ICD-10-CM

## 2023-03-08 DIAGNOSIS — F5105 Insomnia due to other mental disorder: Secondary | ICD-10-CM

## 2023-03-08 MED ORDER — CLONAZEPAM 0.5 MG PO TABS: ORAL_TABLET | ORAL | 1 refills | Status: DC

## 2023-03-08 NOTE — Telephone Encounter (Signed)
Next appt is 05/24/23. Requesting refill on Clonazepam. He needs a written prescription so he can pick up here and then get filled. He has 3 more left. His phone number is [phone].

## 2023-03-08 NOTE — Telephone Encounter (Signed)
PEDNED FOR CC TO PRINT

## 2023-03-11 ENCOUNTER — Other Ambulatory Visit: Payer: Self-pay

## 2023-03-11 DIAGNOSIS — F5105 Insomnia due to other mental disorder: Secondary | ICD-10-CM

## 2023-03-11 DIAGNOSIS — F4010 Social phobia, unspecified: Secondary | ICD-10-CM

## 2023-03-11 MED ORDER — CLONAZEPAM 0.5 MG PO TABS: ORAL_TABLET | ORAL | 1 refills | Status: DC

## 2023-03-11 NOTE — Telephone Encounter (Signed)
Lf 1/5 for 25 days lv 10/14 nv 04/14 NEEDS A PRINTED RX   You previously approved, but we are unable to locate the script.

## 2023-05-05 ENCOUNTER — Other Ambulatory Visit: Payer: Self-pay | Admitting: Psychiatry

## 2023-05-05 DIAGNOSIS — F4010 Social phobia, unspecified: Secondary | ICD-10-CM

## 2023-05-05 DIAGNOSIS — F5105 Insomnia due to other mental disorder: Secondary | ICD-10-CM

## 2023-05-24 ENCOUNTER — Encounter: Payer: Self-pay | Admitting: Psychiatry

## 2023-05-24 ENCOUNTER — Ambulatory Visit (INDEPENDENT_AMBULATORY_CARE_PROVIDER_SITE_OTHER): Payer: Self-pay | Admitting: Psychiatry

## 2023-05-24 DIAGNOSIS — F410 Panic disorder [episodic paroxysmal anxiety] without agoraphobia: Secondary | ICD-10-CM

## 2023-05-24 DIAGNOSIS — F5105 Insomnia due to other mental disorder: Secondary | ICD-10-CM

## 2023-05-24 DIAGNOSIS — F4010 Social phobia, unspecified: Secondary | ICD-10-CM

## 2023-05-24 MED ORDER — SERTRALINE HCL 25 MG PO TABS: 25.0000 mg | ORAL_TABLET | Freq: Every day | ORAL | 0 refills | Status: DC

## 2023-05-24 MED ORDER — CLONAZEPAM 0.5 MG PO TABS: ORAL_TABLET | ORAL | 5 refills | Status: DC

## 2023-05-24 NOTE — Progress Notes (Signed)
 Blake [name] [id number] 1990/10/25 33 y.o.  Subjective:   Patient ID:  Blake [name] is a 33 y.o. (DOB 07-Oct-1990) male.  Chief Complaint:  Chief Complaint  Patient presents with   Follow-up    Blake [name] presents to the office today for follow-up of anxiety and history of depression.  Poor response to SSRIs.  seen April 15, 2018 by Anne Fu, PA.  His only psychiatric med was clonazepam 0.5 3 times daily as needed anxiety.  No meds were changed.  seen June without med changes.  Disc Viibryd for residual anxiety but didn't start.  seen October 2020 without med changes.  He continued clonazepam, propranolol as needed social anxiety and performance anxiety, doxylamine for sleep.  4/21 appt with following noted and no med changes. Overall alright.  Trying to make the best of the stress in the world. About the same Re anxiety. and better than when first seen a couple of years ago.  No panic attacks. Mainly anxiety but sometimes anxiety causes the depression.  11/23/2019 appointment with the following noted: Consistently takes Klonopin 0.5 mg in AM and 1.0 mg HS for sleep. Otherwise gets anxious racing thoughts at night.  Infrequent propranolol. Some initial insomnia.  Caffeine up to 8 PM. No new concerns.  No SE. Social anxiety can include racing negative thoughts and sweats.   Consistently takes Klonopin 0.5 mg in AM and 1.0 mg HS for sleep. Otherwise gets anxious racing thoughts at night.  Propranolol prn causes a little drowsiness but does help.  Plan: no med changes  05/23/2020 appointment with the following noted:   Still finds clonazepam helpful.  More anxiety when starting new job 2 mos ago but leveled out to normal now. Have friend who works there and that helps.  Rarely uses propranolol bc not needed. Sleep good with clonazepam.  Physical labor with work helps him sleep better.   No SE. No concerns with meds and no changes desired. No drug abuse.  11/21/20  appt noted: Pretty good with typical work stress.  No panic lately 1-2 years. Residual anxiety manageable.  Exercise and more active has helped.  Still benefits with clonazepam 1mg  HS and also on 0.5 mg AM. No SE Plan: Okay to continue clonazepam 0.5 mg every morning and 1 mg nightly. Only psych med.  08/21/2021 appointment with the following noted: Been good overall.  Work slowing down and more stress than usual. 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  No major avoidance except some social avoidance.   Lately clonazepam 0.5 mg TID. No SE. Pt reports that mood is Anxious and not depressed and describes anxiety as about the samel. Anxiety symptoms include: Excessive Worry, Social Anxiety,.   Some social avoidance.   Average 9 hours.  Pt reports that appetite is good. Pt reports that energy is no change and good. Concentration is good. Suicidal thoughts:  denied by patient.  No panic lately.  05/26/22 appt: A little worse anxiety bc work slowed down and may have to change jobs.   Only psych med clonazepam 0.5 mg TID and some days feels likes needs another.  Will feel anxious.  Most of anxiety is social.  Even fear of returning to school bc social anxiety.   No SE.   Plan Okay to continue clonazepam 0.5 mg TID & increase to 4 prn.  11/23/22 appt noted: Meds clonazepam as above.  Occ extra. Work changes in [employer].  Still on the  fence about work. [place] property in family got damaged.  Couldn't reach parents.   Works for [employer] and doors. Clonazepam helps without SE.  Still helps.   No full panic.   Sleep well.    05/24/23 appt noted: Med: clonazepam clonazepam 0.5 mg TID & increase to 4 prn. Still trouble getting TEVA bc everything else Beryle Lathe is bad Currently taking Advagen.  Can only find it.  Not as good as Teva but better than others.   Has anxiety.  Other brands seem like only 75% as much active ingredeient and wear off quicker.    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     Past Psychiatric Medication Trials:  Fluoxetine, Lexapro irritable, sertraline lethargy, citalopram, duloxetine, clomipramine, Wellbutrin,Trintellix worsening anxiety, buspirone, mirtazapine Propranolol Clonazepam 0.5 A and 1.0HS lithium, lamotrigine,  Adderall,    Review of Systems:  Review of Systems  Cardiovascular:  Negative for palpitations.  Neurological:  Negative for dizziness and tremors.  Psychiatric/Behavioral:  The patient is nervous/anxious.    Medications: I have reviewed the patient's current medications.  Current Outpatient Medications  Medication Sig Dispense Refill   clonazePAM (KLONOPIN) 0.5 MG tablet TAKE 1 TABLET BY MOUTH 3 TIMES DAILY AND 1 EXTRA AS NEEDED FOR SEVERE ANXIETY 120 tablet 5   doxylamine, Sleep, (UNISOM) 25 MG tablet Take 25 mg by mouth at bedtime as needed. (Patient not taking: Reported on 11/23/2022)     No current facility-administered medications for this visit.    Medication Side Effects: None  Allergies: No Known Allergies  Past Medical History:  Diagnosis Date   ADD 09/21/2008   Depression    GERD 09/14/2008   History of chicken pox    HYPERHIDROSIS 09/14/2008   Other seborrheic dermatitis 09/14/2008    Family History  Problem Relation Age of Onset   Hyperlipidemia Mother    Hyperlipidemia Father    Mental illness Maternal Uncle    Alcohol abuse Other        uncle    Social History   Socioeconomic History   Marital status: Single    Spouse name: Not on file   Number of children: Not on file   Years of education: Not on file   Highest education level: Not on file  Occupational History   Not on file  Tobacco Use   Smoking status: Former    Current packs/day: 0.50    Average packs/day: 0.5 packs/day for 5.0 years (2.5 ttl pk-yrs)    Types: Cigarettes   Smokeless tobacco: Never   Tobacco comments:    E-Cig  Substance and Sexual Activity    Alcohol use: Yes   Drug use: No   Sexual activity: Not on file  Other Topics Concern   Not on file  Social History Narrative   Not on file   Social Drivers of Health   Financial Resource Strain: Not on file  Food Insecurity: Not on file  Transportation Needs: Not on file  Physical Activity: Not on file  Stress: Not on file  Social Connections: Not on file  Intimate Partner Violence: Not on file    Past Medical History, Surgical history, Social history, and Family history were reviewed and updated as appropriate.  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    Please see review of systems for further details on the patient's review from today.   Objective:   Physical Exam:  There were no vitals taken for this visit.  Physical Exam Constitutional:      General: He is not in acute distress.    Appearance: He is well-developed.  Musculoskeletal:        General: No deformity.  Neurological:     Mental Status: He is alert and oriented to person, place, and time.     Coordination: Coordination normal.  Psychiatric:        Attention and Perception: Attention normal. He is attentive.        Mood and Affect: Affect normal. Mood is anxious. Mood is not depressed. Affect is not labile or angry.        Speech: Speech normal. Speech is not slurred.        Behavior: Behavior normal.        Thought Content: Thought content normal. Thought content is not delusional. Thought content does not include homicidal or suicidal ideation. Thought content does not include suicidal plan.        Cognition and Memory: Cognition normal.        Judgment: Judgment normal.     Comments: Insight is good. Anxiety manageable. Some irritability     Lab Review:     Component Value Date/Time   NA 143 01/14/2015 1057   K 5.0 01/14/2015 1057   CL 104 01/14/2015 1057   CO2 28 01/14/2015 1057   GLUCOSE 93 01/14/2015 1057   BUN 15 01/14/2015 1057    CREATININE 1.05 01/14/2015 1057   CALCIUM 9.8 01/14/2015 1057   PROT 6.8 01/14/2015 1057   ALBUMIN 4.6 01/14/2015 1057   AST 18 01/14/2015 1057   ALT 30 01/14/2015 1057   ALKPHOS 60 01/14/2015 1057   BILITOT 0.4 01/14/2015 1057       Component Value Date/Time   WBC 7.9 01/14/2015 1057   RBC 5.57 01/14/2015 1057   HGB 16.4 01/14/2015 1057   HCT 48.6 01/14/2015 1057   PLT 226.0 01/14/2015 1057   MCV 87.4 01/14/2015 1057   MCHC 33.7 01/14/2015 1057   RDW 12.6 01/14/2015 1057   LYMPHSABS 2.5 01/14/2015 1057   MONOABS 0.6 01/14/2015 1057   EOSABS 0.3 01/14/2015 1057   BASOSABS 0.0 01/14/2015 1057    No results found for: "POCLITH", "LITHIUM"   No results found for: "PHENYTOIN", "PHENOBARB", "VALPROATE", "CBMZ"   .res Assessment: Plan:    Blake [name] was seen today for follow-up.  Diagnoses and all orders for this visit:  Social anxiety disorder -     clonazePAM (KLONOPIN) 0.5 MG tablet; TAKE 1 TABLET BY MOUTH 3 TIMES DAILY AND 1 EXTRA AS NEEDED FOR SEVERE ANXIETY  Insomnia due to mental condition -     clonazePAM (KLONOPIN) 0.5 MG tablet; TAKE 1 TABLET BY MOUTH 3 TIMES DAILY AND 1 EXTRA AS NEEDED FOR SEVERE ANXIETY  Panic disorder   face to face time with patient was spent on counseling and coordination of care. We discussed following. Reviewed his multiple med failures with him.  He has not responded well to SSRIs in the past.  Answered questions about Genesight testing and other genetic testing before.  At this point it is not likely to change treatment recommendations..  Probably not in his case.Viibryd option as this is not a traditional SSRI and is not as dependent on the serotonin transporter as other SSRIs may have a greater potential to work.  Discussed side effects.  Satisfied  He feels that the clonazepam is effective and wants to continue that.  Saw  helpful also for his sleep.  Okay to continue clonazepam 0.5 mg TID & increase to 4 prn. Disc difference between  generics.  [facility] preferred.  If unavailable, any generic except Accord.   Retry sertraline at low dose. 12.5 mg to 25 mg daily.  We discussed the short-term risks associated with benzodiazepines including sedation and increased fall risk among others.  Discussed long-term side effect risk including dependence, potential withdrawal symptoms, and the potential eventual dose-related risk of dementia.  But recent studies from 2020 dispute this association between benzodiazepines and dementia risk. Newer studies in 2020 do not support an association with dementia. http://rich.org/  decaf after 3 PM.  PDMP clean  Follow-up 2-63months DT no insurance and stability with low dose  Nori Beat, MD, DFAPA  Please see After Visit Summary for patient specific instructions.  No future appointments.    No orders of the defined types were placed in this encounter.   -------------------------------

## 2023-08-16 ENCOUNTER — Other Ambulatory Visit: Payer: Self-pay | Admitting: Psychiatry

## 2023-08-16 DIAGNOSIS — F4010 Social phobia, unspecified: Secondary | ICD-10-CM

## 2023-08-16 DIAGNOSIS — F410 Panic disorder [episodic paroxysmal anxiety] without agoraphobia: Secondary | ICD-10-CM

## 2023-08-23 ENCOUNTER — Ambulatory Visit: Payer: Self-pay | Admitting: Psychiatry

## 2023-11-14 ENCOUNTER — Other Ambulatory Visit: Payer: Self-pay | Admitting: Psychiatry

## 2023-11-14 DIAGNOSIS — F4010 Social phobia, unspecified: Secondary | ICD-10-CM

## 2023-11-14 DIAGNOSIS — F410 Panic disorder [episodic paroxysmal anxiety] without agoraphobia: Secondary | ICD-10-CM

## 2023-11-15 ENCOUNTER — Ambulatory Visit (INDEPENDENT_AMBULATORY_CARE_PROVIDER_SITE_OTHER): Payer: Self-pay | Admitting: Psychiatry

## 2023-11-15 ENCOUNTER — Encounter: Payer: Self-pay | Admitting: Psychiatry

## 2023-11-15 DIAGNOSIS — F4010 Social phobia, unspecified: Secondary | ICD-10-CM

## 2023-11-15 DIAGNOSIS — F5105 Insomnia due to other mental disorder: Secondary | ICD-10-CM

## 2023-11-15 DIAGNOSIS — F410 Panic disorder [episodic paroxysmal anxiety] without agoraphobia: Secondary | ICD-10-CM

## 2023-11-15 MED ORDER — CLONAZEPAM 0.5 MG PO TABS: ORAL_TABLET | ORAL | 5 refills | Status: AC

## 2023-11-15 NOTE — Patient Instructions (Signed)
 Options: Viibryd, clonidine

## 2023-11-15 NOTE — Progress Notes (Signed)
 Blake [name] [id number] [birth year] 33 y.o.  Subjective:   Patient ID:  Blake [name] is a 33 y.o. (DOB 1990-11-10) male.  Chief Complaint:  Chief Complaint  Patient presents with   Follow-up   Anxiety   Depression    [name] presents to the office today for follow-up of anxiety and history of depression.  Poor response to SSRIs.  seen April 15, 2018 by Roslynn Hermanns, PA.  His only psychiatric med was clonazepam  0.5 3 times daily as needed anxiety.  No meds were changed.  seen June without med changes.  Disc Viibryd for residual anxiety but didn't start.  seen October 2020 without med changes.  He continued clonazepam , propranolol  as needed social anxiety and performance anxiety, doxylamine for sleep.  4/21 appt with following noted and no med changes. Overall alright.  Trying to make the best of the stress in the world. About the same Re anxiety. and better than when first seen a couple of years ago.  No panic attacks. Mainly anxiety but sometimes anxiety causes the depression.  11/23/2019 appointment with the following noted: Consistently takes Klonopin  0.5 mg in AM and 1.0 mg HS for sleep. Otherwise gets anxious racing thoughts at night.  Infrequent propranolol . Some initial insomnia.  Caffeine up to 8 PM. No new concerns.  No SE. Social anxiety can include racing negative thoughts and sweats.   Consistently takes Klonopin  0.5 mg in AM and 1.0 mg HS for sleep. Otherwise gets anxious racing thoughts at night.  Propranolol  prn causes a little drowsiness but does help.  Plan: no med changes  05/23/2020 appointment with the following noted:   Still finds clonazepam  helpful.  More anxiety when starting new job 2 mos ago but leveled out to normal now. Have friend who works there and that helps.  Rarely uses propranolol  bc not needed. Sleep good with clonazepam .  Physical labor with work helps him sleep better.   No SE. No concerns with meds and no changes desired. No  drug abuse.  11/21/20 appt noted: Pretty good with typical work stress.  No panic lately 1-2 years. Residual anxiety manageable.  Exercise and more active has helped.  Still benefits with clonazepam  1mg  HS and also on 0.5 mg AM. No SE Plan: Okay to continue clonazepam  0.5 mg every morning and 1 mg nightly. Only psych med.  08/21/2021 appointment with the following noted: Been good overall.  Work slowing down and more stress than usual. 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  No major avoidance except some social avoidance.   Lately clonazepam  0.5 mg TID. No SE. Pt reports that mood is Anxious and not depressed and describes anxiety as about the samel. Anxiety symptoms include: Excessive Worry, Social Anxiety,.   Some social avoidance.   Average 9 hours.  Pt reports that appetite is good. Pt reports that energy is no change and good. Concentration is good. Suicidal thoughts:  denied by patient.  No panic lately.  05/26/22 appt: A little worse anxiety bc work slowed down and may have to change jobs.   Only psych med clonazepam  0.5 mg TID and some days feels likes needs another.  Will feel anxious.  Most of anxiety is social.  Even fear of returning to school bc social anxiety.   No SE.   Plan Okay to continue clonazepam  0.5 mg TID & increase to 4 prn.  11/23/22 appt noted: Meds clonazepam  as above.  Occ extra. Work changes in  volume seasonally.  Still on the fence about work. [place] property in family got damaged.  Couldn't reach parents.   Works for [employer] and doors. Clonazepam  helps without SE.  Still helps.   No full panic.   Sleep well.    05/24/23 appt noted: Med: clonazepam  clonazepam  0.5 mg TID & increase to 4 prn. Still trouble getting TEVA bc everything else [place] is bad Currently taking Advagen.  Can only find it.  Not as good as Teva but better than others.   Has anxiety.  Other brands seem like only 75% as much active ingredeient  and wear off quicker.   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   Plan: start sertraline  25 mg  Continue clonazepam  0.5 mg BID and 1 mg HS.  11/15/23 appt Meds as noted A little benefit sertraline  first few months but not now SE bruxism a little Last few mos EMA 430 am regardless of time to sleep majority of the time.  When working 6 hours of sleep but if can get back to sleep then 8 hours.   Anxiety is about the same now but initially it helped to take sertraline .      Past Psychiatric Medication Trials:  Fluoxetine , Lexapro  irritable, sertraline  lethargy, citalopram, duloxetine, clomipramine,  Wellbutrin,Trintellix worsening anxiety, buspirone, mirtazapine Propranolol  Clonazepam   lithium, lamotrigine,  Adderall,    Review of Systems:  Review of Systems  HENT:         Mild bruxism  Cardiovascular:  Negative for palpitations.  Neurological:  Negative for dizziness and tremors.  Psychiatric/Behavioral:  The patient is nervous/anxious.    Medications: I have reviewed the patient's current medications.  Current Outpatient Medications  Medication Sig Dispense Refill   clonazePAM  (KLONOPIN ) 0.5 MG tablet TAKE 1 TABLET BY MOUTH 3 TIMES DAILY AND 1 EXTRA AS NEEDED FOR SEVERE ANXIETY 120 tablet 5   doxylamine, Sleep, (UNISOM) 25 MG tablet Take 25 mg by mouth at bedtime as needed. (Patient not taking: Reported on 11/23/2022)     No current facility-administered medications for this visit.    Medication Side Effects: None  Allergies: No Known Allergies  Past Medical History:  Diagnosis Date   ADD 09/21/2008   Depression    GERD 09/14/2008   History of chicken pox    HYPERHIDROSIS 09/14/2008   Other seborrheic dermatitis 09/14/2008    Family History  Problem Relation Age of Onset   Hyperlipidemia Mother    Hyperlipidemia Father    Mental illness Maternal Uncle    Alcohol abuse Other        uncle    Social History   Socioeconomic  History   Marital status: Single    Spouse name: Not on file   Number of children: Not on file   Years of education: Not on file   Highest education level: Not on file  Occupational History   Not on file  Tobacco Use   Smoking status: Former    Current packs/day: 0.50    Average packs/day: 0.5 packs/day for 5.0 years (2.5 ttl pk-yrs)    Types: Cigarettes   Smokeless tobacco: Never   Tobacco comments:    E-Cig  Substance and Sexual Activity   Alcohol use: Yes   Drug use: No   Sexual activity: Not on file  Other Topics Concern   Not on file  Social History Narrative   Not on file   Social Drivers of Health  Financial Resource Strain: Not on file  Food Insecurity: Not on file  Transportation Needs: Not on file  Physical Activity: Not on file  Stress: Not on file  Social Connections: Not on file  Intimate Partner Violence: Not on file    Past Medical History, Surgical history, Social history, and Family history were reviewed and updated as appropriate.  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    Please see review of systems for further details on the patient's review from today.   Objective:   Physical Exam:  There were no vitals taken for this visit.  Physical Exam Constitutional:      General: He is not in acute distress.    Appearance: He is well-developed.  Musculoskeletal:        General: No deformity.  Neurological:     Mental Status: He is alert and oriented to person, place, and time.     Coordination: Coordination normal.  Psychiatric:        Attention and Perception: Attention normal. He is attentive.        Mood and Affect: Affect normal. Mood is anxious. Mood is not depressed. Affect is not labile or angry.        Speech: Speech normal. Speech is not slurred.        Behavior: Behavior normal.        Thought Content: Thought content normal. Thought content is not delusional. Thought content does not  include homicidal or suicidal ideation. Thought content does not include suicidal plan.        Cognition and Memory: Cognition normal.        Judgment: Judgment normal.     Comments: Insight is good. Anxiety manageable but chronic. No sig irritability     Lab Review:     Component Value Date/Time   NA 143 01/14/2015 1057   K 5.0 01/14/2015 1057   CL 104 01/14/2015 1057   CO2 28 01/14/2015 1057   GLUCOSE 93 01/14/2015 1057   BUN 15 01/14/2015 1057   CREATININE 1.05 01/14/2015 1057   CALCIUM 9.8 01/14/2015 1057   PROT 6.8 01/14/2015 1057   ALBUMIN 4.6 01/14/2015 1057   AST 18 01/14/2015 1057   ALT 30 01/14/2015 1057   ALKPHOS 60 01/14/2015 1057   BILITOT 0.4 01/14/2015 1057       Component Value Date/Time   WBC 7.9 01/14/2015 1057   RBC 5.57 01/14/2015 1057   HGB 16.4 01/14/2015 1057   HCT 48.6 01/14/2015 1057   PLT 226.0 01/14/2015 1057   MCV 87.4 01/14/2015 1057   MCHC 33.7 01/14/2015 1057   RDW 12.6 01/14/2015 1057   LYMPHSABS 2.5 01/14/2015 1057   MONOABS 0.6 01/14/2015 1057   EOSABS 0.3 01/14/2015 1057   BASOSABS 0.0 01/14/2015 1057    No results found for: POCLITH, LITHIUM   No results found for: PHENYTOIN, PHENOBARB, VALPROATE, CBMZ   .res Assessment: Plan:    Fuquan was seen today for follow-up, anxiety and depression.  Diagnoses and all orders for this visit:  Social anxiety disorder -     clonazePAM  (KLONOPIN ) 0.5 MG tablet; TAKE 1 TABLET BY MOUTH 3 TIMES DAILY AND 1 EXTRA AS NEEDED FOR SEVERE ANXIETY  Panic disorder  Insomnia due to mental condition -     clonazePAM  (KLONOPIN ) 0.5 MG tablet; TAKE 1 TABLET BY MOUTH 3 TIMES DAILY AND 1 EXTRA AS NEEDED FOR SEVERE ANXIETY    face to face time  with patient was spent on counseling and coordination of care. We discussed following. Reviewed his multiple med failures with him.  He has not responded well to SSRIs in the past.  Answered questions about Genesight testing and other genetic  testing before.  At this point it is not likely to change treatment recommendations..  Probably not in his case.Viibryd option as this is not a traditional SSRI and is not as dependent on the serotonin transporter as other SSRIs may have a greater potential to work.  Discussed side effects.  Satisfied  He feels that the clonazepam  is effective and wants to continue that.  Saw helpful also for his sleep.  Okay to continue clonazepam  0.5 mg TID & increase to 4 prn. Disc difference between generics.  Been getting Advagen  [facility] preferred and should be available at   If unavailable, any generic except [facility].   Wean off sertraline  DT limited effet, bruxism, and potentially insomnia.   Options Viibryd, clonidine off label for anxiety.  Disc SE and MOA.  He wants to defer.   We discussed the short-term risks associated with benzodiazepines including sedation and increased fall risk among others.  Discussed long-term side effect risk including dependence, potential withdrawal symptoms, and the potential eventual dose-related risk of dementia.  But recent studies from 2020 dispute this association between benzodiazepines and dementia risk. Newer studies in 2020 do not support an association with dementia. http://rich.org/  decaf after 3 PM.  PDMP clean  Follow-up 6 months DT no insurance and stability with low dose  Lorene Macintosh, MD, DFAPA  Please see After Visit Summary for patient specific instructions.  No future appointments.    No orders of the defined types were placed in this encounter.   -------------------------------

## 2024-05-15 ENCOUNTER — Ambulatory Visit: Payer: Self-pay | Admitting: Psychiatry
